# Patient Record
Sex: Female | Born: 1975
Health system: Southern US, Community
[De-identification: ages and names within clinical notes are randomized; demographics above are authoritative.]

## PROBLEM LIST (undated history)

## (undated) DIAGNOSIS — K219 Gastro-esophageal reflux disease without esophagitis: Secondary | ICD-10-CM

## (undated) DIAGNOSIS — I10 Essential (primary) hypertension: Secondary | ICD-10-CM

## (undated) HISTORY — PX: TUBAL LIGATION: SHX77

---

## 2017-03-09 ENCOUNTER — Encounter (HOSPITAL_BASED_OUTPATIENT_CLINIC_OR_DEPARTMENT_OTHER): Payer: Self-pay | Admitting: Emergency Medicine

## 2017-03-09 ENCOUNTER — Emergency Department (HOSPITAL_BASED_OUTPATIENT_CLINIC_OR_DEPARTMENT_OTHER)
Admission: EM | Admit: 2017-03-09 | Discharge: 2017-03-09 | Disposition: A | Discharge status: Home / Self Care | Payer: BLUE CROSS/BLUE SHIELD | Attending: Emergency Medicine | Admitting: Emergency Medicine

## 2017-03-09 DIAGNOSIS — M25512 Pain in left shoulder: Secondary | ICD-10-CM | POA: Diagnosis not present

## 2017-03-09 DIAGNOSIS — M791 Myalgia: Secondary | ICD-10-CM | POA: Insufficient documentation

## 2017-03-09 DIAGNOSIS — M7918 Myalgia, other site: Secondary | ICD-10-CM

## 2017-03-09 DIAGNOSIS — M545 Low back pain, unspecified: Secondary | ICD-10-CM

## 2017-03-09 LAB — URINALYSIS, MICROSCOPIC (REFLEX): RBC / HPF: NONE SEEN RBC/hpf (ref 0–5)

## 2017-03-09 LAB — URINALYSIS, ROUTINE W REFLEX MICROSCOPIC
Bilirubin Urine: NEGATIVE
Glucose, UA: NEGATIVE mg/dL
Ketones, ur: NEGATIVE mg/dL
LEUKOCYTES UA: NEGATIVE
NITRITE: NEGATIVE
Protein, ur: NEGATIVE mg/dL
SPECIFIC GRAVITY, URINE: 1.007 (ref 1.005–1.030)
pH: 6.5 (ref 5.0–8.0)

## 2017-03-09 LAB — PREGNANCY, URINE: PREG TEST UR: NEGATIVE

## 2017-03-09 MED ORDER — METHOCARBAMOL 500 MG PO TABS: 500.0000 mg | ORAL_TABLET | Freq: Two times a day (BID) | ORAL | 0 refills | Status: DC

## 2017-03-09 MED ORDER — METHOCARBAMOL 500 MG PO TABS
750.0000 mg | ORAL_TABLET | Freq: Once | ORAL | Status: AC
  Administered 2017-03-09: 750 mg via ORAL
  Filled 2017-03-09: qty 2

## 2017-03-09 NOTE — ED Provider Notes (Signed)
[facility]   CSN: [id number] Arrival date & time: 03/09/17  2026     History   Chief Complaint Chief Complaint  Patient presents with  . Back Pain    HPI Ashlee Berger is a 41 y.o. female.  HPI  41 y.o. female, presents to the Emergency Department today due to left shoulder pain as well as back pain x several days. Denies trauma or injury to the area. States pain worse with movement in lower back and TTP. This occurred x several days ago. Notes no urinary symptoms. No dysuria. No fevers. No abd pain. No N/V. No chills. Also notes left shoulder pain that started today. Worse with ROM. Pain with palpation on posterior aspect. No numbness/tingling. Rates pain 4/10. Intermittent. Took tylenol PTA. No CP/SOB. No other symptoms noted.    History reviewed. No pertinent past medical history.  There are no active problems to display for this patient.   History reviewed. No pertinent surgical history.  OB History    No data available       Home Medications    Prior to Admission medications   Not on File    Family History No family history on file.  Social History Social History  Substance Use Topics  . Smoking status: Not on file  . Smokeless tobacco: Not on file  . Alcohol use Not on file     Allergies   Patient has no known allergies.   Review of Systems Review of Systems ROS reviewed and all are negative for acute change except as noted in the HPI.  Physical Exam Updated Vital Signs BP (!) 141/101 (BP Location: Left Arm)   Pulse 100   Temp 98.3 F (36.8 C) (Oral)   Resp 18   Ht 5\' 2"  (1.575 m)   Wt 82.1 kg (181 lb)   SpO2 100%   BMI 33.11 kg/m   Physical Exam  Constitutional: She is oriented to person, place, and time. Vital signs are normal. She appears well-developed and well-nourished.  HENT:  Head: Normocephalic and atraumatic.  Right Ear: Hearing normal.  Left Ear: Hearing normal.  Eyes: Pupils are equal,  round, and reactive to light. Conjunctivae and EOM are normal.  Neck: Normal range of motion. Neck supple.  Cardiovascular: Normal rate, regular rhythm, normal heart sounds and intact distal pulses.   Pulmonary/Chest: Effort normal and breath sounds normal.  Abdominal: Soft. There is no tenderness.  Musculoskeletal: Normal range of motion.  Left Shoulder: Negative hawkins test, negative Neer's test, TTP over posterior shoulder. No pain with flexion/extension/abduction/adduction internal or external rotation. No obvious bony deformity.  Neurological: She is alert and oriented to person, place, and time.  Skin: Skin is warm and dry.  Psychiatric: She has a normal mood and affect. Her speech is normal and behavior is normal. Thought content normal.  Nursing note and vitals reviewed.    ED Treatments / Results  Labs (all labs ordered are listed, but only abnormal results are displayed) Labs Reviewed  URINALYSIS, ROUTINE W REFLEX MICROSCOPIC - Abnormal; Notable for the following:       Result Value   Hgb urine dipstick TRACE (*)    All other components within normal limits  URINALYSIS, MICROSCOPIC (REFLEX) - Abnormal; Notable for the following:    Bacteria, UA RARE (*)    Squamous Epithelial / LPF 0-5 (*)    All other components within normal limits  PREGNANCY, URINE    EKG  EKG Interpretation None  Radiology No results found.  Procedures Procedures (including critical care time)  Medications Ordered in ED Medications - No data to display   Initial Impression / Assessment and Plan / ED Course  I have reviewed the triage vital signs and the nursing notes.  Pertinent labs & imaging results that were available during my care of the patient were reviewed by me and considered in my medical decision making (see chart for details).  Final Clinical Impressions(s) / ED Diagnoses  {I have reviewed and evaluated the relevant laboratory values.   {I have reviewed the relevant  previous healthcare records.  {I obtained HPI from historian.   ED Course:  Assessment: Pt is a 41 y.o. female wpresents to the Emergency Department today due to left shoulder pain as well as back pain x several days. Denies trauma or injury to the area. States pain worse with movement in lower back and TTP. This occurred x several days ago. Notes no urinary symptoms. No dysuria. No fevers. No abd pain. No N/V. No chills. Also notes left shoulder pain that started today. Worse with ROM. Pain with palpation on posterior aspect. No numbness/tingling. Rates pain 4/10. Intermittent. Took tylenol PTA. No CP/SOB.. On exam, pt in NAD. Nontoxic/nonseptic appearing. VSS. Afebrile. Lungs CTA. Heart RRR. Abdomen nontender soft. Shoulder TPP posterior aspect. Likely rotator cuff injury. Low back pain unilateral on left aspect. No midline tenderness. Pain with palpation. Pain with ROM. Likely musculoskeletal as well. Labs unremarkable. Given analgesia in ED. Plan is to DC home with follow up to PCP. At time of discharge, Patient is in no acute distress. Vital Signs are stable. Patient is able to ambulate. Patient able to tolerate PO.    Disposition/Plan:  DC Home Additional Verbal discharge instructions given and discussed with patient.  Pt Instructed to f/u with PCP in the next week for evaluation and treatment of symptoms. Return precautions given Pt acknowledges and agrees with plan  Supervising Physician Doug SouJacubowitz, Sam, MD  Final diagnoses:  Acute left-sided low back pain without sciatica  Acute pain of left shoulder  Musculoskeletal pain    New Prescriptions New Prescriptions   No medications on file     Wilber BihariMohr, Farzad Tibbetts, PA-C 03/09/17 2221    Doug SouJacubowitz, Sam, MD 03/10/17 [phone]

## 2017-03-09 NOTE — Discharge Instructions (Signed)
Please read and follow all provided instructions.  Your diagnoses today include:  1. Acute left-sided low back pain without sciatica   2. Acute pain of left shoulder   3. Musculoskeletal pain     Tests performed today include: Vital signs. See below for your results today.   Medications prescribed:  Take as prescribed   Home care instructions:  Follow any educational materials contained in this packet.  Follow-up instructions: Please follow-up with your primary care provider for further evaluation of symptoms and treatment   Return instructions:  Please return to the Emergency Department if you do not get better, if you get worse, or new symptoms OR  - Fever (temperature greater than 101.38F)  - Bleeding that does not stop with holding pressure to the area    -Severe pain (please note that you may be more sore the day after your accident)  - Chest Pain  - Difficulty breathing  - Severe nausea or vomiting  - Inability to tolerate food and liquids  - Passing out  - Skin becoming red around your wounds  - Change in mental status (confusion or lethargy)  - New numbness or weakness    Please return if you have any other emergent concerns.  Additional Information:  Your vital signs today were: BP (!) 141/101 (BP Location: Left Arm)    Pulse 100    Temp 98.3 F (36.8 C) (Oral)    Resp 18    Ht 5\' 2"  (1.575 m)    Wt 82.1 kg (181 lb)    SpO2 100%    BMI 33.11 kg/m  If your blood pressure (BP) was elevated above 135/85 this visit, please have this repeated by your doctor within one month. ---------------

## 2017-03-09 NOTE — ED Notes (Signed)
PMS intact before and after. Pt tolerated well. All questions answered. 

## 2017-03-09 NOTE — ED Triage Notes (Signed)
PT presents with c/o left shoulder and back pain for a couple of days. Pt reports no injury

## 2017-03-09 NOTE — ED Notes (Signed)
Pt c/o lower back  and left arm and shoulder pain for 3-4 days now.  She also complaint of frontal headache.

## 2017-05-16 ENCOUNTER — Emergency Department (HOSPITAL_BASED_OUTPATIENT_CLINIC_OR_DEPARTMENT_OTHER): Payer: BLUE CROSS/BLUE SHIELD

## 2017-05-16 ENCOUNTER — Encounter (HOSPITAL_BASED_OUTPATIENT_CLINIC_OR_DEPARTMENT_OTHER): Payer: Self-pay | Admitting: *Deleted

## 2017-05-16 ENCOUNTER — Emergency Department (HOSPITAL_BASED_OUTPATIENT_CLINIC_OR_DEPARTMENT_OTHER)
Admission: EM | Admit: 2017-05-16 | Discharge: 2017-05-16 | Disposition: A | Discharge status: Home / Self Care | Payer: BLUE CROSS/BLUE SHIELD | Attending: Physician Assistant | Admitting: Physician Assistant

## 2017-05-16 DIAGNOSIS — Z79899 Other long term (current) drug therapy: Secondary | ICD-10-CM | POA: Insufficient documentation

## 2017-05-16 DIAGNOSIS — R0789 Other chest pain: Secondary | ICD-10-CM | POA: Diagnosis not present

## 2017-05-16 DIAGNOSIS — K449 Diaphragmatic hernia without obstruction or gangrene: Secondary | ICD-10-CM

## 2017-05-16 DIAGNOSIS — R079 Chest pain, unspecified: Secondary | ICD-10-CM | POA: Diagnosis not present

## 2017-05-16 DIAGNOSIS — I10 Essential (primary) hypertension: Secondary | ICD-10-CM | POA: Insufficient documentation

## 2017-05-16 DIAGNOSIS — R0602 Shortness of breath: Secondary | ICD-10-CM | POA: Diagnosis present

## 2017-05-16 HISTORY — DX: Essential (primary) hypertension: I10

## 2017-05-16 LAB — COMPREHENSIVE METABOLIC PANEL
ALK PHOS: 69 U/L (ref 38–126)
ALT: 12 U/L — ABNORMAL LOW (ref 14–54)
ANION GAP: 5 (ref 5–15)
AST: 12 U/L — ABNORMAL LOW (ref 15–41)
Albumin: 3.9 g/dL (ref 3.5–5.0)
BILIRUBIN TOTAL: 0.4 mg/dL (ref 0.3–1.2)
BUN: 19 mg/dL (ref 6–20)
CALCIUM: 8.6 mg/dL — AB (ref 8.9–10.3)
CO2: 27 mmol/L (ref 22–32)
Chloride: 105 mmol/L (ref 101–111)
Creatinine, Ser: 0.84 mg/dL (ref 0.44–1.00)
GFR calc non Af Amer: >60 mL/min (ref >60)
Glucose, Bld: 90 mg/dL (ref 65–99)
Potassium: 3.1 mmol/L — ABNORMAL LOW (ref 3.5–5.1)
SODIUM: 137 mmol/L (ref 135–145)
TOTAL PROTEIN: 6.7 g/dL (ref 6.5–8.1)

## 2017-05-16 LAB — D-DIMER, QUANTITATIVE: D-Dimer, Quant: 1 ug/mL-FEU — ABNORMAL HIGH (ref 0.00–0.50)

## 2017-05-16 LAB — CBC WITH DIFFERENTIAL/PLATELET
BASOS ABS: 0 10*3/uL (ref 0.0–0.1)
BASOS PCT: 0 %
Eosinophils Absolute: 0.2 10*3/uL (ref 0.0–0.7)
Eosinophils Relative: 2 %
HEMATOCRIT: 33 % — AB (ref 36.0–46.0)
HEMOGLOBIN: 11.1 g/dL — AB (ref 12.0–15.0)
Lymphocytes Relative: 41 %
Lymphs Abs: 3.9 10*3/uL (ref 0.7–4.0)
MCH: 31.1 pg (ref 26.0–34.0)
MCHC: 33.6 g/dL (ref 30.0–36.0)
MCV: 92.4 fL (ref 78.0–100.0)
Monocytes Absolute: 0.7 10*3/uL (ref 0.1–1.0)
Monocytes Relative: 8 %
NEUTROS ABS: 4.6 10*3/uL (ref 1.7–7.7)
NEUTROS PCT: 49 %
Platelets: 346 10*3/uL (ref 150–400)
RBC: 3.57 MIL/uL — ABNORMAL LOW (ref 3.87–5.11)
RDW: 12.4 % (ref 11.5–15.5)
WBC: 9.4 10*3/uL (ref 4.0–10.5)

## 2017-05-16 LAB — TROPONIN I

## 2017-05-16 LAB — PREGNANCY, URINE: PREG TEST UR: NEGATIVE

## 2017-05-16 MED ORDER — IOPAMIDOL (ISOVUE-370) INJECTION 76%
100.0000 mL | Freq: Once | INTRAVENOUS | Status: AC | PRN
  Administered 2017-05-16: 100 mL via INTRAVENOUS

## 2017-05-16 NOTE — ED Notes (Signed)
Pt returned from CT °

## 2017-05-16 NOTE — ED Notes (Signed)
Pt attempting to urinate at this time

## 2017-05-16 NOTE — ED Notes (Signed)
ED Provider at bedside. 

## 2017-05-16 NOTE — Discharge Instructions (Signed)
There are several good things about your workup. You did not have a pulmonary embolism. Your troponin (which shows stress on her heart) is negative. We did see an hernia on your CT of her chest. Sometimes when these are very symptomatic surgeons will repair them. Please follow-up with the surgeon as needed.

## 2017-05-16 NOTE — ED Notes (Signed)
Pt to XR at this time

## 2017-05-16 NOTE — ED Provider Notes (Signed)
[facility]   CSN: [id number] Arrival date & time: 05/16/17  1408     History   Chief Complaint Chief Complaint  Patient presents with  . Shortness of Breath  . Back Pain    HPI Ashlee Berger is a 41 y.o. female.  HPI   41 year old healthy-appearing female presenting with chest pain. She reports is worse with taking deep breaths. Patient initially statedit had going on a week. But when explored further patient had stress test done last month as a result the same type of chest pain. Patient is tearful during interview. When asked if she is having increased stress at home she stated no. She reports she was being seen by urgent care 2. She d"doesn't know the results of any my blood tests or x-rays". We have no access to those here.  She is on low-dose oral contraceptives.  Past Medical History:  Diagnosis Date  . Hypertension     There are no active problems to display for this patient.   History reviewed. No pertinent surgical history.  OB History    No data available       Home Medications    Prior to Admission medications   Medication Sig Start Date End Date Taking? Authorizing Provider  LISINOPRIL PO Take by mouth.   Yes [provider]  methocarbamol (ROBAXIN) 500 MG tablet Take 1 tablet (500 mg total) by mouth 2 (two) times daily. 03/09/17   Audry Pili, PA-C    Family History No family history on file.  Social History Social History  Substance Use Topics  . Smoking status: Never Smoker  . Smokeless tobacco: Never Used  . Alcohol use No     Allergies   Patient has no known allergies.   Review of Systems Review of Systems  Constitutional: Negative for activity change.  Respiratory: Negative for shortness of breath.   Cardiovascular: Positive for chest pain.  Gastrointestinal: Negative for abdominal pain.     Physical Exam Updated Vital Signs BP 120/74 (BP Location: Left Arm)   Pulse 76   Temp 97.7  F (36.5 C) (Oral)   Resp 16   Ht  (1.575 m)   Wt 80.7 kg (178 lb)   SpO2 100%   BMI 32.56 kg/m   Physical Exam  Constitutional: She is oriented to person, place, and time. She appears well-developed and well-nourished.  HENT:  Head: Normocephalic and atraumatic.  Eyes: Right eye exhibits no discharge.  Cardiovascular: Normal rate and regular rhythm.   No murmur heard. Pulmonary/Chest: Effort normal and breath sounds normal. No respiratory distress.  Abdominal: She exhibits no distension. There is no tenderness.  Neurological: She is oriented to person, place, and time.  Skin: Skin is warm and dry. She is not diaphoretic.  Psychiatric: She has a normal mood and affect.  Nursing note and vitals reviewed.    ED Treatments / Results  Labs (all labs ordered are listed, but only abnormal results are displayed) Labs Reviewed  COMPREHENSIVE METABOLIC PANEL - Abnormal; Notable for the following:       Result Value   Potassium 3.1 (*)    Calcium 8.6 (*)    AST 12 (*)    ALT 12 (*)    All other components within normal limits  CBC WITH DIFFERENTIAL/PLATELET - Abnormal; Notable for the following:    RBC 3.57 (*)    Hemoglobin 11.1 (*)    HCT 33.0 (*)    All other components within  normal limits  D-DIMER, QUANTITATIVE (NOT AT [facility]) - Abnormal; Notable for the following:    D-Dimer, Quant 1.00 (*)    All other components within normal limits  TROPONIN I  PREGNANCY, URINE    EKG  EKG Interpretation None       Radiology Dg Chest 2 View  Result Date: 05/16/2017 CLINICAL DATA:  LEFT chest pain for 1 month. EXAM: CHEST  2 VIEW COMPARISON:  None. FINDINGS: The heart size and mediastinal contours are within normal limits. Both lungs are clear. The visualized skeletal structures are unremarkable. IMPRESSION: No active cardiopulmonary disease. Electronically Signed   By: Elsie Stain M.D.   On: 05/16/2017 17:05   Ct Angio Chest Pe W And/or Wo Contrast  Result Date:  05/16/2017 CLINICAL DATA:  Cough and congestion, chest pain and shortness of breath for 1 month. EXAM: CT ANGIOGRAPHY CHEST WITH CONTRAST TECHNIQUE: Multidetector CT imaging of the chest was performed using the standard protocol during bolus administration of intravenous contrast. Multiplanar CT image reconstructions and MIPs were obtained to evaluate the vascular anatomy. CONTRAST:  Isovue 370, 100 mL. COMPARISON:  Chest radiograph earlier today. FINDINGS: Cardiovascular: Satisfactory opacification of the pulmonary arteries to the segmental level. No evidence of pulmonary embolism. Normal heart size. No pericardial effusion. Mediastinum/Nodes: No enlarged mediastinal, hilar, or axillary lymph nodes. Thyroid gland, and trachea demonstrate no significant findings. There is a moderate-sized retrocardiac hiatal hernia containing some layering fluid and air. Lungs/Pleura: Lungs are clear. No pleural effusion or pneumothorax. Upper Abdomen: No acute abnormality. Musculoskeletal: No chest wall abnormality. No acute or significant osseous findings. Minor thoracic disc disease. Review of the MIP images confirms the above findings. IMPRESSION: No evidence for pulmonary emboli. No infiltrate, edema, or effusion. Moderate size retrocardiac hiatal hernia, significance insert. Electronically Signed   By: Elsie Stain M.D.   On: 05/16/2017 18:35    Procedures Procedures (including critical care time)  Medications Ordered in ED Medications  iopamidol (ISOVUE-370) 76 % injection 100 mL (100 mLs Intravenous Contrast Given 05/16/17 1814)     Initial Impression / Assessment and Plan / ED Course  I have reviewed the triage vital signs and the nursing notes.  Pertinent labs & imaging results that were available during my care of the patient were reviewed by me and considered in my medical decision making (see chart for details).      41 year old healthy-appearing female presenting with chest pain. She reports is  worse with taking deep breaths. Patient initially statedit had going on a week. But when explored further patient had stress test done last month as a result the same type of chest pain. Patient is tearful during interview. When asked if she is having increased stress at home she stated no. She reports she was being seen by urgent care 2. She "doesn't know the results of any my blood tests or x-rays". We have no access to those here.  She is on low-dose oral contraceptives.  5:01 PM  Sounds the patient has already had quite a workup given that she's had a stress test at age of 56. However we will do d-dimer, chest x-ray today.  D-dimer positive CT negative for PE.  Patient's CT shows hiatal hernia. This could be cause of her symptoms. We discussed with her that sometimes if  very symptomatic, surgeons will intervene. We'll have her follow up with surgery as an outpatient.  Final Clinical Impressions(s) / ED Diagnoses   Final diagnoses:  Other chest pain  Hiatal hernia  New Prescriptions Discharge Medication List as of 05/16/2017  6:56 PM       Abelino Derrick, MD 05/16/17 2317

## 2017-05-16 NOTE — ED Triage Notes (Addendum)
Cough and congestion. She was been seen at [facility] x 2 and had negative CXR, Ddimer and given Mobic and Vicodin for pain. Not getting better. No distress at triage.

## 2017-05-20 ENCOUNTER — Emergency Department (HOSPITAL_BASED_OUTPATIENT_CLINIC_OR_DEPARTMENT_OTHER)
Admission: EM | Admit: 2017-05-20 | Discharge: 2017-05-20 | Disposition: A | Discharge status: Home / Self Care | Payer: BLUE CROSS/BLUE SHIELD | Attending: Emergency Medicine | Admitting: Emergency Medicine

## 2017-05-20 ENCOUNTER — Encounter (HOSPITAL_BASED_OUTPATIENT_CLINIC_OR_DEPARTMENT_OTHER): Payer: Self-pay | Admitting: *Deleted

## 2017-05-20 DIAGNOSIS — R1084 Generalized abdominal pain: Secondary | ICD-10-CM | POA: Diagnosis not present

## 2017-05-20 DIAGNOSIS — R05 Cough: Secondary | ICD-10-CM | POA: Insufficient documentation

## 2017-05-20 DIAGNOSIS — I10 Essential (primary) hypertension: Secondary | ICD-10-CM | POA: Insufficient documentation

## 2017-05-20 DIAGNOSIS — Z79899 Other long term (current) drug therapy: Secondary | ICD-10-CM | POA: Insufficient documentation

## 2017-05-20 DIAGNOSIS — R109 Unspecified abdominal pain: Secondary | ICD-10-CM | POA: Diagnosis present

## 2017-05-20 LAB — URINALYSIS, ROUTINE W REFLEX MICROSCOPIC
Bilirubin Urine: NEGATIVE
Glucose, UA: NEGATIVE mg/dL
Ketones, ur: NEGATIVE mg/dL
LEUKOCYTES UA: NEGATIVE
NITRITE: NEGATIVE
PROTEIN: NEGATIVE mg/dL
Specific Gravity, Urine: 1.025 (ref 1.005–1.030)
pH: 6 (ref 5.0–8.0)

## 2017-05-20 LAB — COMPREHENSIVE METABOLIC PANEL
ALT: 12 U/L — AB (ref 14–54)
AST: 14 U/L — ABNORMAL LOW (ref 15–41)
Albumin: 3.7 g/dL (ref 3.5–5.0)
Alkaline Phosphatase: 59 U/L (ref 38–126)
Anion gap: 7 (ref 5–15)
BUN: 15 mg/dL (ref 6–20)
CHLORIDE: 106 mmol/L (ref 101–111)
CO2: 24 mmol/L (ref 22–32)
CREATININE: 0.78 mg/dL (ref 0.44–1.00)
Calcium: 8.7 mg/dL — ABNORMAL LOW (ref 8.9–10.3)
GFR calc non Af Amer: >60 mL/min (ref >60)
Glucose, Bld: 83 mg/dL (ref 65–99)
Potassium: 3.4 mmol/L — ABNORMAL LOW (ref 3.5–5.1)
SODIUM: 137 mmol/L (ref 135–145)
Total Bilirubin: 0.4 mg/dL (ref 0.3–1.2)
Total Protein: 6.5 g/dL (ref 6.5–8.1)

## 2017-05-20 LAB — URINALYSIS, MICROSCOPIC (REFLEX)

## 2017-05-20 LAB — CBC
HEMATOCRIT: 32.4 % — AB (ref 36.0–46.0)
HEMOGLOBIN: 10.8 g/dL — AB (ref 12.0–15.0)
MCH: 30.8 pg (ref 26.0–34.0)
MCHC: 33.3 g/dL (ref 30.0–36.0)
MCV: 92.3 fL (ref 78.0–100.0)
Platelets: 309 10*3/uL (ref 150–400)
RBC: 3.51 MIL/uL — AB (ref 3.87–5.11)
RDW: 12.7 % (ref 11.5–15.5)
WBC: 7.8 10*3/uL (ref 4.0–10.5)

## 2017-05-20 LAB — LIPASE, BLOOD: Lipase: 35 U/L (ref 11–51)

## 2017-05-20 LAB — PREGNANCY, URINE: Preg Test, Ur: NEGATIVE

## 2017-05-20 NOTE — ED Provider Notes (Signed)
[facility]   CSN: [id number] Arrival date & time: 05/20/17  1409     History   Chief Complaint Chief Complaint  Patient presents with  . Abdominal Pain    HPI Ashlee Berger is a 41 y.o. female.  41yo F w/ PMH including HTN who p/w abdominal pain. Yesterday she began having generalized, intermittent moderate abdominal pain that occasionally radiates to both sides of her back. Pain is worse at work, where she stands all day and does a lot lifting. Pain seems to be better with rest, no association with eating. She's had nausea, no vomiting, fevers, diarrhea, or bloody stools. No dysuria, vaginal bleeding or discharge. She occasionally has blood in urine which is being followed by a nephrologist. She has had mild cough recently. No heavy NSAID or alcohol use.    The history is provided by the patient.  Abdominal Pain      Past Medical History:  Diagnosis Date  . Hypertension     There are no active problems to display for this patient.   History reviewed. No pertinent surgical history.  OB History    No data available       Home Medications    Prior to Admission medications   Medication Sig Start Date End Date Taking? Authorizing Provider  LISINOPRIL PO Take by mouth.    [provider]  methocarbamol (ROBAXIN) 500 MG tablet Take 1 tablet (500 mg total) by mouth 2 (two) times daily. 03/09/17   Audry Pili, PA-C    Family History No family history on file.  Social History Social History  Substance Use Topics  . Smoking status: Never Smoker  . Smokeless tobacco: Never Used  . Alcohol use No     Allergies   Patient has no known allergies.   Review of Systems Review of Systems  Gastrointestinal: Positive for abdominal pain.  All other systems reviewed and are negative except that which was mentioned in HPI    Physical Exam Updated Vital Signs BP (!) 132/97 (BP Location: Left Arm)   Pulse 78   Temp 98.4 F  (36.9 C) (Oral)   Resp 16   Ht  (1.575 m)   Wt 80.7 kg (178 lb)   LMP  (LMP Unknown)   SpO2 100%   BMI 32.56 kg/m   Physical Exam  Constitutional: She is oriented to person, place, and time. She appears well-developed and well-nourished. No distress.  comfortable  HENT:  Head: Normocephalic and atraumatic.  Moist mucous membranes  Eyes: Pupils are equal, round, and reactive to light. Conjunctivae are normal.  Neck: Neck supple.  Cardiovascular: Normal rate, regular rhythm and normal heart sounds.   No murmur heard. Pulmonary/Chest: Effort normal and breath sounds normal.  Abdominal: Soft. Bowel sounds are normal. She exhibits no distension. There is no tenderness.  Musculoskeletal: She exhibits no edema.  Neurological: She is alert and oriented to person, place, and time.  Fluent speech  Skin: Skin is warm and dry.  Psychiatric: She has a normal mood and affect. Judgment normal.  Calm and cooperative  Nursing note and vitals reviewed.    ED Treatments / Results  Labs (all labs ordered are listed, but only abnormal results are displayed) Labs Reviewed  URINALYSIS, ROUTINE W REFLEX MICROSCOPIC - Abnormal; Notable for the following:       Result Value   Hgb urine dipstick SMALL (*)    All other components within normal limits  COMPREHENSIVE METABOLIC PANEL -  Abnormal; Notable for the following:    Potassium 3.4 (*)    Calcium 8.7 (*)    AST 14 (*)    ALT 12 (*)    All other components within normal limits  CBC - Abnormal; Notable for the following:    RBC 3.51 (*)    Hemoglobin 10.8 (*)    HCT 32.4 (*)    All other components within normal limits  URINALYSIS, MICROSCOPIC (REFLEX) - Abnormal; Notable for the following:    Bacteria, UA FEW (*)    Squamous Epithelial / LPF 0-5 (*)    All other components within normal limits  PREGNANCY, URINE  LIPASE, BLOOD    EKG  EKG Interpretation None       Radiology No results found.  Procedures Procedures  (including critical care time)  Medications Ordered in ED Medications - No data to display   Initial Impression / Assessment and Plan / ED Course  I have reviewed the triage vital signs and the nursing notes.  Pertinent labs  that were available during my care of the patient were reviewed by me and considered in my medical decision making (see chart for details).     PT w/ intermittent abdominal pain since yesterday, no other associated sx. Well appearing with normal VS on exam. No focal abd tenderness. Labs unremarkable with normal WBC count, LFTS, lipase, and creatinine. UA negative. Mild anemia. PT remains comfortable and pain free on re-eval. Given no pain or tenderness, I do not feel she needs imaging currently. Reviewed return precautions.  Final Clinical Impressions(s) / ED Diagnoses   Final diagnoses:  Generalized abdominal pain    New Prescriptions Discharge Medication List as of 05/20/2017  5:18 PM       Kynnadi Dicenso, Ambrose Finland, MD 05/21/17 1201

## 2017-05-20 NOTE — ED Notes (Signed)
ED Provider at bedside. 

## 2017-05-20 NOTE — ED Triage Notes (Signed)
She was seen 4 days ago and diagnosed with a abdominal hernia. Back today with pain. She has not seen the surgeon yet.

## 2017-05-29 ENCOUNTER — Other Ambulatory Visit: Payer: Self-pay | Admitting: General Surgery

## 2017-05-29 DIAGNOSIS — K8020 Calculus of gallbladder without cholecystitis without obstruction: Secondary | ICD-10-CM

## 2017-06-03 ENCOUNTER — Ambulatory Visit
Admission: RE | Admit: 2017-06-03 | Discharge: 2017-06-03 | Disposition: A | Discharge status: Home / Self Care | Payer: BLUE CROSS/BLUE SHIELD | Source: Ambulatory Visit | Attending: General Surgery | Admitting: General Surgery

## 2017-06-03 DIAGNOSIS — K8020 Calculus of gallbladder without cholecystitis without obstruction: Secondary | ICD-10-CM

## 2017-06-05 ENCOUNTER — Other Ambulatory Visit (HOSPITAL_COMMUNITY): Payer: Self-pay | Admitting: General Surgery

## 2017-06-05 DIAGNOSIS — K449 Diaphragmatic hernia without obstruction or gangrene: Secondary | ICD-10-CM

## 2017-06-24 ENCOUNTER — Ambulatory Visit (HOSPITAL_COMMUNITY): Admission: RE | Admit: 2017-06-24 | Payer: BLUE CROSS/BLUE SHIELD | Source: Ambulatory Visit

## 2017-07-12 ENCOUNTER — Emergency Department (HOSPITAL_BASED_OUTPATIENT_CLINIC_OR_DEPARTMENT_OTHER): Payer: BLUE CROSS/BLUE SHIELD

## 2017-07-12 ENCOUNTER — Other Ambulatory Visit: Payer: Self-pay

## 2017-07-12 ENCOUNTER — Emergency Department (HOSPITAL_BASED_OUTPATIENT_CLINIC_OR_DEPARTMENT_OTHER)
Admission: EM | Admit: 2017-07-12 | Discharge: 2017-07-12 | Disposition: A | Discharge status: Home / Self Care | Payer: BLUE CROSS/BLUE SHIELD | Attending: Emergency Medicine | Admitting: Emergency Medicine

## 2017-07-12 ENCOUNTER — Encounter (HOSPITAL_BASED_OUTPATIENT_CLINIC_OR_DEPARTMENT_OTHER): Payer: Self-pay | Admitting: *Deleted

## 2017-07-12 DIAGNOSIS — Z79899 Other long term (current) drug therapy: Secondary | ICD-10-CM | POA: Insufficient documentation

## 2017-07-12 DIAGNOSIS — I10 Essential (primary) hypertension: Secondary | ICD-10-CM | POA: Insufficient documentation

## 2017-07-12 DIAGNOSIS — M79605 Pain in left leg: Secondary | ICD-10-CM | POA: Diagnosis not present

## 2017-07-12 DIAGNOSIS — R0789 Other chest pain: Secondary | ICD-10-CM | POA: Diagnosis not present

## 2017-07-12 DIAGNOSIS — R079 Chest pain, unspecified: Secondary | ICD-10-CM | POA: Diagnosis present

## 2017-07-12 LAB — BASIC METABOLIC PANEL
ANION GAP: 7 (ref 5–15)
BUN: 16 mg/dL (ref 6–20)
CALCIUM: 8.9 mg/dL (ref 8.9–10.3)
CO2: 22 mmol/L (ref 22–32)
CREATININE: 0.89 mg/dL (ref 0.44–1.00)
Chloride: 104 mmol/L (ref 101–111)
GLUCOSE: 86 mg/dL (ref 65–99)
Potassium: 3.4 mmol/L — ABNORMAL LOW (ref 3.5–5.1)
Sodium: 133 mmol/L — ABNORMAL LOW (ref 135–145)

## 2017-07-12 LAB — CBC WITH DIFFERENTIAL/PLATELET
BASOS ABS: 0 10*3/uL (ref 0.0–0.1)
BASOS PCT: 1 %
EOS PCT: 3 %
Eosinophils Absolute: 0.2 10*3/uL (ref 0.0–0.7)
HEMATOCRIT: 32.9 % — AB (ref 36.0–46.0)
Hemoglobin: 11.1 g/dL — ABNORMAL LOW (ref 12.0–15.0)
Lymphocytes Relative: 39 %
Lymphs Abs: 2.4 10*3/uL (ref 0.7–4.0)
MCH: 30.7 pg (ref 26.0–34.0)
MCHC: 33.7 g/dL (ref 30.0–36.0)
MCV: 91.1 fL (ref 78.0–100.0)
MONO ABS: 0.6 10*3/uL (ref 0.1–1.0)
MONOS PCT: 10 %
NEUTROS ABS: 2.8 10*3/uL (ref 1.7–7.7)
Neutrophils Relative %: 47 %
PLATELETS: 339 10*3/uL (ref 150–400)
RBC: 3.61 MIL/uL — ABNORMAL LOW (ref 3.87–5.11)
RDW: 12.3 % (ref 11.5–15.5)
WBC: 6 10*3/uL (ref 4.0–10.5)

## 2017-07-12 LAB — PREGNANCY, URINE: Preg Test, Ur: NEGATIVE

## 2017-07-12 LAB — D-DIMER, QUANTITATIVE: D-Dimer, Quant: 0.27 ug/mL-FEU (ref 0.00–0.50)

## 2017-07-12 LAB — TROPONIN I

## 2017-07-12 NOTE — ED Triage Notes (Signed)
Sharp pain in the center of her chest x 2 weeks.

## 2017-07-12 NOTE — ED Provider Notes (Signed)
[facility] EMERGENCY DEPARTMENT Provider Note   CSN: [id number] Arrival date & time: 07/12/17  1408     History   Chief Complaint Chief Complaint  Patient presents with  . Chest Pain    HPI Ashlee Berger is a 41 y.o. female.  The history is provided by the patient.  Chest Pain   This is a new problem. The current episode started more than 1 week ago. The problem occurs daily. The problem has not changed since onset.The pain is associated with movement and breathing. The pain is present in the substernal region. The pain is moderate. The quality of the pain is described as sharp. The pain does not radiate. Duration of episode(s) is 2 weeks. The symptoms are aggravated by certain positions and deep breathing. Associated symptoms include leg pain. Pertinent negatives include no abdominal pain, no back pain, no cough, no diaphoresis, no fever, no lower extremity edema, no nausea, no near-syncope, no shortness of breath, no sputum production, no syncope, no vomiting and no weakness. Treatments tried: tylenol. The treatment provided no relief. There are no known risk factors.   41 year old female who presents with chest pain.  Endorses intermittent anterior chest pain for 2 weeks, worse with deep inspiration and movement.  Has not had similar symptoms in the past.  Denies any heavy lifting, exertional activity or trauma.  Denies associated shortness of breath, syncope or near syncope, leg swelling, nausea or vomiting, or diaphoresis.  Has noted some mild left calf tenderness to palpation but no swelling.  Take Depo-Provera birth control but no estrogen replacement therapy.  No recent immobilization.  No personal or family history of PE/DVT or early family history of cardiac disease.   Past Medical History:  Diagnosis Date  . Hypertension     There are no active problems to display for this patient.   History reviewed. No pertinent surgical history.  OB History    No data  available       Home Medications    Prior to Admission medications   Medication Sig Start Date End Date Taking? Authorizing Provider  LISINOPRIL PO Take by mouth.    [provider]  methocarbamol (ROBAXIN) 500 MG tablet Take 1 tablet (500 mg total) by mouth 2 (two) times daily. 03/09/17   Audry PiliMohr, Tyler, PA-C    Family History No family history on file.  Social History Social History   Tobacco Use  . Smoking status: Never Smoker  . Smokeless tobacco: Never Used  Substance Use Topics  . Alcohol use: No  . Drug use: No     Allergies   Patient has no known allergies.   Review of Systems Review of Systems  Constitutional: Negative for diaphoresis and fever.  Respiratory: Negative for cough, sputum production and shortness of breath.   Cardiovascular: Positive for chest pain. Negative for syncope and near-syncope.  Gastrointestinal: Negative for abdominal pain, nausea and vomiting.  Musculoskeletal: Negative for back pain.  Neurological: Negative for weakness.  All other systems reviewed and are negative.    Physical Exam Updated Vital Signs BP 119/87 (BP Location: Right Arm)   Pulse (!) 101   Temp 98.2 F (36.8 C) (Oral)   Resp 18   Ht 5\' 2"  (1.575 m)   Wt 81.2 kg (179 lb)   SpO2 100%   BMI 32.74 kg/m   Physical Exam Physical Exam  Nursing note and vitals reviewed. Constitutional: Well developed, well nourished, non-toxic, and in no acute distress Head:  Normocephalic and atraumatic.  Mouth/Throat: Oropharynx is clear and moist.  Neck: Normal range of motion. Neck supple.  Cardiovascular: Normal rate and regular rhythm.   Pulmonary/Chest: Effort normal and breath sounds normal. Tenderness to palpation over sternum Abdominal: Soft. There is no tenderness. There is no rebound and no guarding.  Musculoskeletal: Normal range of motion. No leg swelling. Left calf tenderness to palpation. Neurological: Alert, no facial droop, fluent speech, moves all  extremities symmetrically Skin: Skin is warm and dry.  Psychiatric: Cooperative   ED Treatments / Results  Labs (all labs ordered are listed, but only abnormal results are displayed) Labs Reviewed  CBC WITH DIFFERENTIAL/PLATELET - Abnormal; Notable for the following components:      Result Value   RBC 3.61 (*)    Hemoglobin 11.1 (*)    HCT 32.9 (*)    All other components within normal limits  BASIC METABOLIC PANEL - Abnormal; Notable for the following components:   Sodium 133 (*)    Potassium 3.4 (*)    All other components within normal limits  PREGNANCY, URINE  TROPONIN I  D-DIMER, QUANTITATIVE (NOT AT [facility])    EKG  EKG Interpretation  Date/Time:  Friday July 12 2017 14:15:25 EST Ventricular Rate:  101 PR Interval:  122 QRS Duration: 82 QT Interval:  324 QTC Calculation: 420 R Axis:   39 Text Interpretation:  Sinus tachycardia Otherwise normal ECG increased rate, otherwise no acute changes  Confirmed by Crista Curb [phone]) on 07/12/2017 3:08:40 PM       Radiology Dg Chest 2 View  Result Date: 07/12/2017 CLINICAL DATA:  Chest pain. EXAM: CHEST  2 VIEW COMPARISON:  Radiographs of May 16, 2017. FINDINGS: The heart size and mediastinal contours are within normal limits. Both lungs are clear. No pneumothorax or pleural effusion is noted. The visualized skeletal structures are unremarkable. IMPRESSION: No active cardiopulmonary disease. Electronically Signed   By: Lupita Raider, M.D.   On: 07/12/2017 15:54    Procedures Procedures (including critical care time)  Medications Ordered in ED Medications - No data to display   Initial Impression / Assessment and Plan / ED Course  I have reviewed the triage vital signs and the nursing notes.  Pertinent labs & imaging results that were available during my care of the patient were reviewed by me and considered in my medical decision making (see chart for details).      Suspect musculoskeletal pain. Reproducible  with palpation.  However given concurrent left calf tenderness with initially mildly tachycardic heart rate, did consider PE given pleuritic nature of pain. Ddimer is negative, and she is felt to be ruled out.  Pain seems very atypical for ACS.  Also low suspicion for dissection or other serious cardiopulmonary/intrathoracic etiologies.  Her EKG is nonischemic.  Troponin is normal.  Given ongoing symptoms for 2 weeks I do not think she would benefit from serial cardiac enzymes, and seems atypical for ACS.  Chest x-ray visualized and shows no acute cardiopulmonary processes.  At this time I feel that we have ruled out serious etiologies of her symptoms.  Clinically her pain seems more musculoskeletal in nature, and I reviewed supportive care management for home. Strict return and follow-up instructions reviewed. She expressed understanding of all discharge instructions and felt comfortable with the plan of care.   Final Clinical Impressions(s) / ED Diagnoses   Final diagnoses:  Atypical chest pain  Chest wall pain    ED Discharge Orders    None  Lavera GuiseLiu, Ismaeel Arvelo Duo, MD 07/12/17 [phone]

## 2017-07-12 NOTE — Discharge Instructions (Signed)
Your pain is likely muscle pain. Continue tylenol and heat packs. Follow-up with your PCP for ongoing pain management.  Your work-up today is reassuring. Please return without fail for worsening symptoms, including difficulty breathing, passing out, escalating pain or any other symptoms concerning to you.

## 2017-08-26 ENCOUNTER — Other Ambulatory Visit: Payer: Self-pay

## 2017-08-26 ENCOUNTER — Emergency Department (HOSPITAL_BASED_OUTPATIENT_CLINIC_OR_DEPARTMENT_OTHER)
Admission: EM | Admit: 2017-08-26 | Discharge: 2017-08-26 | Disposition: A | Discharge status: Home / Self Care | Payer: BLUE CROSS/BLUE SHIELD | Attending: Emergency Medicine | Admitting: Emergency Medicine

## 2017-08-26 ENCOUNTER — Emergency Department (HOSPITAL_BASED_OUTPATIENT_CLINIC_OR_DEPARTMENT_OTHER): Payer: BLUE CROSS/BLUE SHIELD

## 2017-08-26 ENCOUNTER — Encounter (HOSPITAL_BASED_OUTPATIENT_CLINIC_OR_DEPARTMENT_OTHER): Payer: Self-pay | Admitting: *Deleted

## 2017-08-26 DIAGNOSIS — M62830 Muscle spasm of back: Secondary | ICD-10-CM | POA: Insufficient documentation

## 2017-08-26 DIAGNOSIS — R110 Nausea: Secondary | ICD-10-CM | POA: Diagnosis not present

## 2017-08-26 DIAGNOSIS — Z79899 Other long term (current) drug therapy: Secondary | ICD-10-CM | POA: Insufficient documentation

## 2017-08-26 DIAGNOSIS — R079 Chest pain, unspecified: Secondary | ICD-10-CM

## 2017-08-26 DIAGNOSIS — R0789 Other chest pain: Secondary | ICD-10-CM | POA: Insufficient documentation

## 2017-08-26 DIAGNOSIS — M546 Pain in thoracic spine: Secondary | ICD-10-CM | POA: Diagnosis not present

## 2017-08-26 DIAGNOSIS — R0602 Shortness of breath: Secondary | ICD-10-CM | POA: Insufficient documentation

## 2017-08-26 HISTORY — DX: Gastro-esophageal reflux disease without esophagitis: K21.9

## 2017-08-26 MED ORDER — CYCLOBENZAPRINE HCL 10 MG PO TABS: 5.0000 mg | ORAL_TABLET | Freq: Every day | ORAL | 0 refills | Status: AC

## 2017-08-26 MED ORDER — ACETAMINOPHEN 500 MG PO TABS: 1000.0000 mg | ORAL_TABLET | Freq: Three times a day (TID) | ORAL | 0 refills | Status: AC

## 2017-08-26 MED FILL — CYCLOBENZAPRINE HCL 10 MG T: 10 | 10 days supply | Qty: 10 | Fill #0

## 2017-08-26 NOTE — ED Provider Notes (Signed)
[facility] EMERGENCY DEPARTMENT Provider Note  CSN: [id number] Arrival date & time: 08/26/17 1408  Chief Complaint(s) Chest Pain  HPI Ashlee Berger is a 42 y.o. female   The history is provided by the patient.  Chest Pain   This is a recurrent problem. Episode onset: 4-5 months. Episode frequency: intermittent. Progression since onset: fluctuating. The pain is associated with breathing, movement and lifting. Pain location: sternal and lateral chest, bilaterally. Pain severity now: mild - moderate. The quality of the pain is described as sharp. Radiates to: around to the back. The symptoms are aggravated by certain positions and deep breathing (upper extremity movement). Associated symptoms include back pain, nausea and shortness of breath (mild). Pertinent negatives include no cough, no diaphoresis, no fever, no leg pain, no lower extremity edema, no malaise/fatigue and no vomiting. Treatments tried: OTC meds, heat and ice. The treatment provided no relief. Risk factors include obesity.  Her past medical history is significant for hypertension.  Pertinent negatives for past medical history include no CAD, no COPD, no CHF, no diabetes, no DVT, no hyperlipidemia, no MI, no PE and no strokes.  Her family medical history is significant for early MI.   Patient reports that she works at a [employer], [employer].  However states that she has not been doing so for several weeks due to this pain.  Past Medical History Past Medical History:  Diagnosis Date  . GERD (gastroesophageal reflux disease)    There are no active problems to display for this patient.  Home Medication(s) Prior to Admission medications   Medication Sig Start Date End Date Taking? Authorizing Provider  acetaminophen (TYLENOL) 500 MG tablet Take 2 tablets (1,000 mg total) by mouth every 8 (eight) hours for 5 days. Do not take more than 4000 mg of acetaminophen (Tylenol) in a 24-hour period. Please note  that other medicines that you may be prescribed may have Tylenol as well. 08/26/17 08/31/17  Nira Conn, MD  cyclobenzaprine (FLEXERIL) 10 MG tablet Take 0.5-1 tablets (5-10 mg total) by mouth at bedtime for 10 days. 08/26/17 09/05/17  Nira Conn, MD  LISINOPRIL PO Take by mouth.    [provider]  methocarbamol (ROBAXIN) 500 MG tablet Take 1 tablet (500 mg total) by mouth 2 (two) times daily. 03/09/17   Audry Pili, PA-C                                                                                                                                    Past Surgical History History reviewed. No pertinent surgical history. Family History No family history on file.  Social History Social History   Tobacco Use  . Smoking status: Never Smoker  . Smokeless tobacco: Never Used  Substance Use Topics  . Alcohol use: No  . Drug use: No   Allergies Patient has no known allergies.  Review of Systems Review of Systems  Constitutional:  Negative for diaphoresis, fever and malaise/fatigue.  Respiratory: Positive for shortness of breath (mild). Negative for cough.   Cardiovascular: Positive for chest pain.  Gastrointestinal: Positive for nausea. Negative for vomiting.  Musculoskeletal: Positive for back pain.    Physical Exam Vital Signs  I have reviewed the triage vital signs BP 125/88   Pulse 97   Temp 98.3 F (36.8 C) (Oral)   Resp 16   Ht 5\' 2"  (1.575 m)   Wt 80.3 kg (177 lb)   SpO2 99%   BMI 32.37 kg/m   Physical Exam  Constitutional: She is oriented to person, place, and time. She appears well-developed and well-nourished. No distress.  HENT:  Head: Normocephalic and atraumatic.  Nose: Nose normal.  Eyes: Conjunctivae and EOM are normal. Pupils are equal, round, and reactive to light. Right eye exhibits no discharge. Left eye exhibits no discharge. No scleral icterus.  Neck: Normal range of motion. Neck supple.  Cardiovascular: Normal rate and  regular rhythm. Exam reveals no gallop and no friction rub.  No murmur heard. Pulmonary/Chest: Effort normal and breath sounds normal. No stridor. No respiratory distress. She has no rales.     She exhibits tenderness.    Abdominal: Soft. She exhibits no distension. There is no tenderness.  Musculoskeletal: She exhibits no edema or tenderness.  Neurological: She is alert and oriented to person, place, and time.  Skin: Skin is warm and dry. No rash noted. She is not diaphoretic. No erythema.  Psychiatric: She has a normal mood and affect.  Vitals reviewed.   ED Results and Treatments Labs (all labs ordered are listed, but only abnormal results are displayed) Labs Reviewed - No data to display                                                                                                                       EKG  EKG Interpretation  Date/Time:  Monday August 26 2017 14:09:58 EST Ventricular Rate:  94 PR Interval:  116 QRS Duration: 118 QT Interval:  360 QTC Calculation: 450 R Axis:   57 Text Interpretation:  Normal sinus rhythm Non-specific intra-ventricular conduction delay Nonspecific ST abnormality Abnormal ECG No significant change since last tracing Confirmed by Drema Pryardama, Kearstin Learn [phone]([id number]) on 08/26/2017 4:07:19 PM      Radiology Dg Chest 2 View  Result Date: 08/26/2017 CLINICAL DATA:  Intermittent chest pain and shortness of breath for the past 6 months. Nonsmoker. EXAM: CHEST  2 VIEW COMPARISON:  Chest x-ray of July 12, 2017 FINDINGS: The lungs are adequately inflated and clear. The heart and pulmonary vascularity are normal. The mediastinum is normal in width. There is no pleural effusion. IMPRESSION: There is no active cardiopulmonary disease. Electronically Signed   By: David  SwazilandJordan M.D.   On: 08/26/2017 16:20   Pertinent labs & imaging results that were available during my care of the patient were reviewed by me and considered in my medical decision making (see chart  for details).  Medications Ordered  in ED Medications - No data to display                                                                                                                                  Procedures Procedures  (including critical care time)  Medical Decision Making / ED Course I have reviewed the nursing notes for this encounter and the patient's prior records (if available in EHR or on provided paperwork).    Presentation is most consistent with chest wall pain.  Highly inconsistent with ACS.  EKG without acute ischemic changes or evidence of pericarditis.  Do not feel that cardiac enzymes are necessary at this time.  Presentation is not classic for aortic dissection or esophageal perforation.  Low pretest probability for pulmonary embolism. Chest x-ray without evidence suggestive of pneumonia, pneumothorax, pneumomediastinum.  No abnormal contour of the mediastinum to suggest dissection. No evidence of acute injuries.   The patient appears reasonably screened and/or stabilized for discharge and I doubt any other medical condition or other [facility] requiring further screening, evaluation, or treatment in the ED at this time prior to discharge.  The patient is safe for discharge with strict return precautions.  Final Clinical Impression(s) / ED Diagnoses Final diagnoses:  Chest pain  Acute bilateral thoracic back pain  Muscle spasm of back   Disposition: Discharge  Condition: Good  I have discussed the results, Dx and Tx plan with the patient who expressed understanding and agree(s) with the plan. Discharge instructions discussed at great length. The patient was given strict return precautions who verbalized understanding of the instructions. No further questions at time of discharge.    ED Discharge Orders        Ordered    acetaminophen (TYLENOL) 500 MG tablet  Every 8 hours     08/26/17 1625    cyclobenzaprine (FLEXERIL) 10 MG tablet  Daily at bedtime     08/26/17  1627       Follow Up: Rehabilitation, [facility] &  Schedule an appointment as soon as possible for a visit  As needed      This chart was dictated using voice recognition software.  Despite best efforts to proofread,  errors can occur which can change the documentation meaning.   Nira Conn, MD 08/26/17 [phone]

## 2017-08-26 NOTE — Discharge Instructions (Signed)
You may use over-the-counter Acetaminophen (Tylenol), topical muscle creams such as SalonPas, Icy Hot, Bengay, etc. Please stretch, apply heat, and have massage therapy for additional assistance.  

## 2017-08-26 NOTE — ED Triage Notes (Signed)
Chest pain. States she feels pain in her back when she takes a breath.

## 2017-10-05 ENCOUNTER — Emergency Department (HOSPITAL_BASED_OUTPATIENT_CLINIC_OR_DEPARTMENT_OTHER): Payer: BLUE CROSS/BLUE SHIELD

## 2017-10-05 ENCOUNTER — Other Ambulatory Visit: Payer: Self-pay

## 2017-10-05 ENCOUNTER — Encounter (HOSPITAL_BASED_OUTPATIENT_CLINIC_OR_DEPARTMENT_OTHER): Payer: Self-pay | Admitting: Emergency Medicine

## 2017-10-05 ENCOUNTER — Emergency Department (HOSPITAL_BASED_OUTPATIENT_CLINIC_OR_DEPARTMENT_OTHER)
Admission: EM | Admit: 2017-10-05 | Discharge: 2017-10-06 | Disposition: A | Discharge status: Home / Self Care | Payer: BLUE CROSS/BLUE SHIELD | Attending: Emergency Medicine | Admitting: Emergency Medicine

## 2017-10-05 DIAGNOSIS — M79662 Pain in left lower leg: Secondary | ICD-10-CM | POA: Insufficient documentation

## 2017-10-05 DIAGNOSIS — Z79899 Other long term (current) drug therapy: Secondary | ICD-10-CM | POA: Insufficient documentation

## 2017-10-05 DIAGNOSIS — R079 Chest pain, unspecified: Secondary | ICD-10-CM

## 2017-10-05 DIAGNOSIS — R0602 Shortness of breath: Secondary | ICD-10-CM | POA: Insufficient documentation

## 2017-10-05 DIAGNOSIS — R072 Precordial pain: Secondary | ICD-10-CM | POA: Insufficient documentation

## 2017-10-05 DIAGNOSIS — R05 Cough: Secondary | ICD-10-CM | POA: Insufficient documentation

## 2017-10-05 LAB — CBC
HEMATOCRIT: 33.1 % — AB (ref 36.0–46.0)
Hemoglobin: 11.1 g/dL — ABNORMAL LOW (ref 12.0–15.0)
MCH: 30.9 pg (ref 26.0–34.0)
MCHC: 33.5 g/dL (ref 30.0–36.0)
MCV: 92.2 fL (ref 78.0–100.0)
PLATELETS: 324 10*3/uL (ref 150–400)
RBC: 3.59 MIL/uL — AB (ref 3.87–5.11)
RDW: 11.9 % (ref 11.5–15.5)
WBC: 6.5 10*3/uL (ref 4.0–10.5)

## 2017-10-05 LAB — BASIC METABOLIC PANEL
Anion gap: 9 (ref 5–15)
BUN: 16 mg/dL (ref 6–20)
CHLORIDE: 106 mmol/L (ref 101–111)
CO2: 23 mmol/L (ref 22–32)
Calcium: 9 mg/dL (ref 8.9–10.3)
Creatinine, Ser: 0.86 mg/dL (ref 0.44–1.00)
GFR calc Af Amer: >60 mL/min (ref >60)
GFR calc non Af Amer: >60 mL/min (ref >60)
Glucose, Bld: 86 mg/dL (ref 65–99)
POTASSIUM: 3.3 mmol/L — AB (ref 3.5–5.1)
SODIUM: 138 mmol/L (ref 135–145)

## 2017-10-05 LAB — PREGNANCY, URINE: Preg Test, Ur: NEGATIVE

## 2017-10-05 LAB — D-DIMER, QUANTITATIVE: D-Dimer, Quant: 0.37 ug/mL-FEU (ref 0.00–0.50)

## 2017-10-05 LAB — TROPONIN I: Troponin I: <0.03 ng/mL (ref <0.03)

## 2017-10-05 MED ORDER — KETOROLAC TROMETHAMINE 30 MG/ML IJ SOLN
15.0000 mg | Freq: Once | INTRAMUSCULAR | Status: AC
  Administered 2017-10-05: 15 mg via INTRAVENOUS
  Filled 2017-10-05: qty 1

## 2017-10-05 MED ORDER — GI COCKTAIL ~~LOC~~
30.0000 mL | Freq: Once | ORAL | Status: AC
  Administered 2017-10-05: 30 mL via ORAL
  Filled 2017-10-05: qty 30

## 2017-10-05 NOTE — ED Triage Notes (Addendum)
Pt reports bilateral upper chest pain that began 2 weeks ago and has been intermittent. She reports pain radiates to her back. Associated shortness of breath, cough, nasal drainage, and PND.

## 2017-10-05 NOTE — ED Notes (Signed)
Patient transported to Ultrasound 

## 2017-10-05 NOTE — ED Notes (Signed)
ED Provider at bedside. 

## 2017-10-06 MED ORDER — METHOCARBAMOL 500 MG PO TABS: 500.0000 mg | ORAL_TABLET | Freq: Two times a day (BID) | ORAL | 0 refills | Status: AC

## 2017-10-06 MED ORDER — OMEPRAZOLE 20 MG PO CPDR: 20.0000 mg | DELAYED_RELEASE_CAPSULE | Freq: Every day | ORAL | 0 refills | Status: AC

## 2017-10-06 NOTE — ED Notes (Signed)
Pt given d/c instructions as per chart. Rx x 2 with precautions. Verbalizes understanding. No questions. 

## 2017-10-06 NOTE — Discharge Instructions (Signed)
Your workup has been very reassuring in the ED today.  Unknown cause of your symptoms.  Likely musculoskeletal.  Will try NSAIDs.  Can give you some muscle relaxers and an acids to help with the pain.  Follow-up with primary care doctor return to the ED with any worsening symptoms.

## 2017-10-06 NOTE — ED Provider Notes (Signed)
[facility] EMERGENCY DEPARTMENT Provider Note   CSN: [id number] Arrival date & time: 10/05/17  1715     History   Chief Complaint Chief Complaint  Patient presents with  . Chest Pain    HPI Ashlee Berger is a 42 y.o. female.  HPI 43 yo female with no sign pmh presents to the ED for eval of cp. CP has been ongoing fro 2 weeks. Pt is intermittent. Substernal and radiates to collar bone and back. Worse with movement. Reports some associated sob at time. Denies any nausea, vomiting, or diaphoresis. She does report some cough that is non productive. Pt states the pain is not exertional or pleuritic. Denies hx of pe/dvt, ocp use, tobacco use, prolonged immobilization, recent hospitalization/surgiers. She does report some left calf pain for the past 2 weeks intermittently without edema. Denies any cardiac hx. Reports family cardiac hx. Nothing makes pain better. She has tried over the counter medications without relief. No hx of same.   Pt denies any fever, chill, ha, vision changes, lightheadedness, dizziness, congestion, neck pain, abd pain, n/v/d, urinary symptoms, change in bowel habits, melena, hematochezia, lower extremity paresthesias.  Past Medical History:  Diagnosis Date  . GERD (gastroesophageal reflux disease)     There are no active problems to display for this patient.   History reviewed. No pertinent surgical history.  OB History    No data available       Home Medications    Prior to Admission medications   Medication Sig Start Date End Date Taking? Authorizing Provider  cholecalciferol (VITAMIN D) 1000 units tablet Take 1,000 Units by mouth daily.   Yes [provider]  LISINOPRIL PO Take by mouth.    [provider]  methocarbamol (ROBAXIN) 500 MG tablet Take 1 tablet (500 mg total) by mouth 2 (two) times daily. 10/06/17   Rise Mu, PA-C  omeprazole (PRILOSEC) 20 MG capsule Take 1 capsule (20 mg total) by mouth daily.  10/06/17   Rise Mu, PA-C    Family History History reviewed. No pertinent family history.  Social History Social History   Tobacco Use  . Smoking status: Never Smoker  . Smokeless tobacco: Never Used  Substance Use Topics  . Alcohol use: No  . Drug use: No     Allergies   Patient has no known allergies.   Review of Systems Review of Systems  Constitutional: Negative for chills, diaphoresis and fever.  HENT: Negative for congestion.   Eyes: Negative for visual disturbance.  Respiratory: Positive for cough and shortness of breath.   Cardiovascular: Positive for chest pain. Negative for palpitations and leg swelling.  Gastrointestinal: Negative for abdominal pain, diarrhea, nausea and vomiting.  Genitourinary: Negative for dysuria, flank pain, frequency, hematuria and urgency.  Musculoskeletal: Positive for myalgias. Negative for arthralgias.  Skin: Negative for rash.  Neurological: Negative for dizziness, syncope, weakness, light-headedness, numbness and headaches.  Psychiatric/Behavioral: Negative for sleep disturbance. The patient is not nervous/anxious.      Physical Exam Updated Vital Signs BP 118/84 (BP Location: Left Arm)   Pulse 76   Temp 98.3 F (36.8 C) (Oral)   Resp 18   Ht 5\' 2"  (1.575 m)   Wt 80.3 kg (177 lb)   SpO2 100%   BMI 32.37 kg/m   Physical Exam  Constitutional: She is oriented to person, place, and time. She appears well-developed and well-nourished.  Non-toxic appearance. No distress.  HENT:  Head: Normocephalic and atraumatic.  Nose: Nose normal.  Mouth/Throat: Oropharynx is clear and moist.  Eyes: Conjunctivae are normal. Pupils are equal, round, and reactive to light. Right eye exhibits no discharge. Left eye exhibits no discharge.  Neck: Normal range of motion. Neck supple. No JVD present. No tracheal deviation present.  Cardiovascular: Normal rate, regular rhythm, normal heart sounds and intact distal pulses. Exam reveals  no gallop and no friction rub.  No murmur heard. Pulmonary/Chest: Effort normal and breath sounds normal. No stridor. No respiratory distress. She has no wheezes. She has no rales. She exhibits tenderness.  No hypoxia or tachypnea.  Abdominal: Soft. Bowel sounds are normal. She exhibits no distension. There is no tenderness. There is no rebound and no guarding.  Musculoskeletal: Normal range of motion.       Right lower leg: Normal. She exhibits no edema.       Left lower leg: Normal. She exhibits no edema.  No lower extremity edema or calf tenderness.  Dp pulses 2+ bilat. Brisk cap refill. Sensation intact.  Lymphadenopathy:    She has no cervical adenopathy.  Neurological: She is alert and oriented to person, place, and time.  Skin: Skin is warm and dry. Capillary refill takes less than 2 seconds. She is not diaphoretic.  Psychiatric: Her behavior is normal. Judgment and thought content normal.  Nursing note and vitals reviewed.    ED Treatments / Results  Labs (all labs ordered are listed, but only abnormal results are displayed) Labs Reviewed  BASIC METABOLIC PANEL - Abnormal; Notable for the following components:      Result Value   Potassium 3.3 (*)    All other components within normal limits  CBC - Abnormal; Notable for the following components:   RBC 3.59 (*)    Hemoglobin 11.1 (*)    HCT 33.1 (*)    All other components within normal limits  TROPONIN I  PREGNANCY, URINE  D-DIMER, QUANTITATIVE (NOT AT [facility])  TROPONIN I    EKG  EKG Interpretation  Date/Time:  Saturday October 05 2017 17:21:09 EST Ventricular Rate:  91 PR Interval:  114 QRS Duration: 84 QT Interval:  336 QTC Calculation: 413 R Axis:   11 Text Interpretation:  Normal sinus rhythm Normal ECG When compared to prior, new q wave in lead 3.  No STEMI Confirmed by Theda Belfastegeler, Chris ([id number]) on 10/05/2017 7:54:12 PM       Radiology Dg Chest 2 View  Result Date: 10/05/2017 CLINICAL DATA:  Chest and  back pain for 1 month. EXAM: CHEST  2 VIEW COMPARISON:  Chest radiograph 06/16/2018 FINDINGS: The heart size and mediastinal contours are within normal limits. Both lungs are clear. The visualized skeletal structures are unremarkable. IMPRESSION: Normal chest. Electronically Signed   By: Deatra RobinsonKevin  Herman M.D.   On: 10/05/2017 19:28   [place] Venous Img Lower Unilateral Left  Result Date: 10/05/2017 CLINICAL DATA:  Left leg pain and swelling for 1 month. EXAM: LEFT LOWER EXTREMITY VENOUS DOPPLER ULTRASOUND TECHNIQUE: Gray-scale sonography with graded compression, as well as color Doppler and duplex ultrasound were performed to evaluate the lower extremity deep venous systems from the level of the common femoral vein and including the common femoral, femoral, profunda femoral, popliteal and calf veins including the posterior tibial, peroneal and gastrocnemius veins when visible. The superficial great saphenous vein was also interrogated. Spectral Doppler was utilized to evaluate flow at rest and with distal augmentation maneuvers in the common femoral, femoral and popliteal veins. COMPARISON:  None. FINDINGS: Contralateral Common Femoral Vein: Respiratory phasicity  is normal and symmetric with the symptomatic side. No evidence of thrombus. Normal compressibility. Common Femoral Vein: No evidence of thrombus. Normal compressibility, respiratory phasicity and response to augmentation. Saphenofemoral Junction: No evidence of thrombus. Normal compressibility and flow on color Doppler imaging. Profunda Femoral Vein: No evidence of thrombus. Normal compressibility and flow on color Doppler imaging. Femoral Vein: No evidence of thrombus. Normal compressibility, respiratory phasicity and response to augmentation. Popliteal Vein: No evidence of thrombus. Normal compressibility, respiratory phasicity and response to augmentation. Calf Veins: No evidence of thrombus. Normal compressibility and flow on color Doppler imaging.  Superficial Great Saphenous Vein: No evidence of thrombus. Normal compressibility. Venous Reflux:  None. Other Findings:  None. IMPRESSION: No evidence of deep venous thrombosis. Electronically Signed   By: Myles Rosenthal M.D.   On: 10/05/2017 21:22    Procedures Procedures (including critical care time)  Medications Ordered in ED Medications  gi cocktail (Maalox,Lidocaine,Donnatal) (30 mLs Oral Given 10/05/17 2133)  ketorolac (TORADOL) 30 MG/ML injection 15 mg (15 mg Intravenous Given 10/05/17 2244)     Initial Impression / Assessment and Plan / ED Course  I have reviewed the triage vital signs and the nursing notes.  Pertinent labs & imaging results that were available during my care of the patient were reviewed by me and considered in my medical decision making (see chart for details).     Pt presents to the Ed today with complaints of cp. Patient is to be discharged with recommendation to follow up with PCP in regards to today's hospital visit. Chest pain is not likely of cardiac or pulmonary etiology d/t presentation, d-dimer negative, VSS, no tracheal deviation, no JVD or new murmur, RRR, breath sounds equal bilaterally, EKG without any change from prior tracing and shows no signs of ischemia, negative delta troponin, and negative CXR. Heart pathway score 1. Very atypical for acs. Clinical presentation not consistent with pe or dissection. Pt has been advised to return to the ED is CP becomes exertional, associated with diaphoresis or nausea, radiates to left jaw/arm, worsens or becomes concerning in any way. Pt appears reliable for follow up and is agreeable to discharge.       Final Clinical Impressions(s) / ED Diagnoses   Final diagnoses:  Nonspecific chest pain    ED Discharge Orders        Ordered    methocarbamol (ROBAXIN) 500 MG tablet  2 times daily     10/06/17 0010    omeprazole (PRILOSEC) 20 MG capsule  Daily     10/06/17 0010       Rise Mu,  PA-C 10/06/17 1610    Tegeler, Canary Brim, MD 10/07/17 [phone]

## 2019-05-02 IMAGING — US US ABDOMEN LIMITED
1 series · 14 of 25 positions shown · non-contrast
Comparison: 03/15/2017

CLINICAL DATA: Right upper quadrant pain, nausea

EXAM:
ULTRASOUND ABDOMEN LIMITED RIGHT UPPER QUADRANT

[Series 1: us abdomen limited · 0.20mm/px · 14 of 29 slices shown]
[im 1/29]
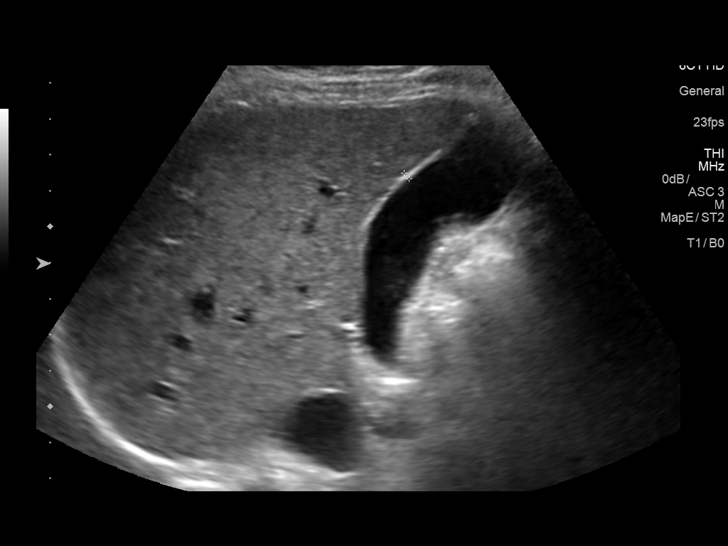
[im 3/29]
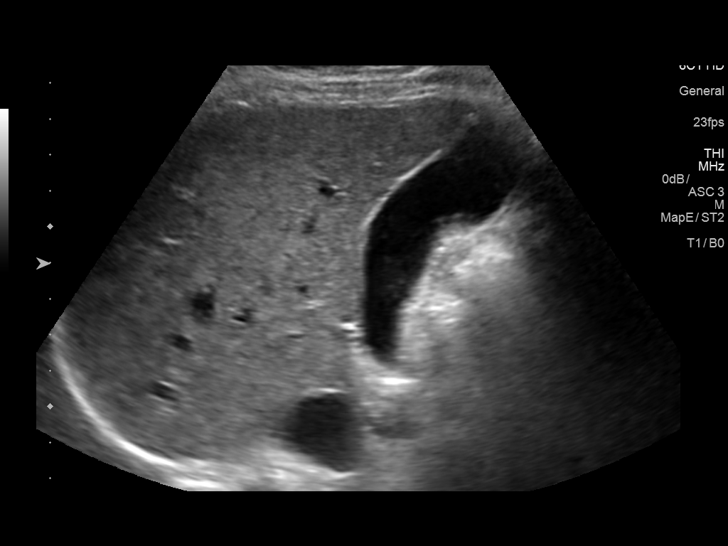
[im 5/29]
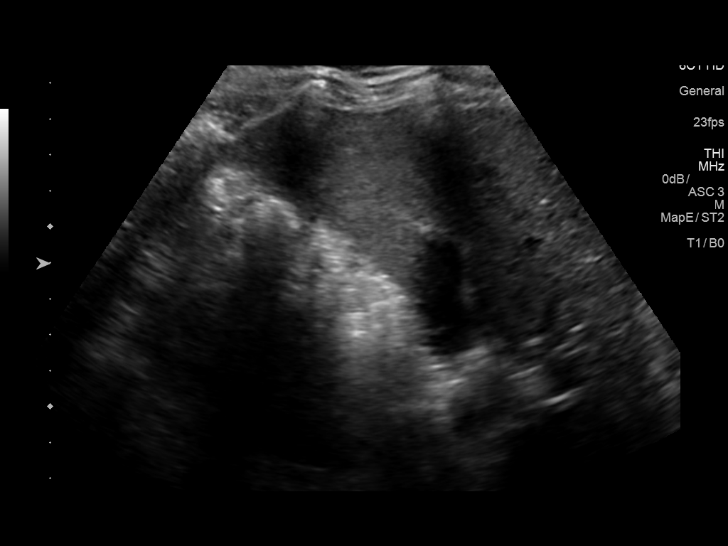
[im 8/29]
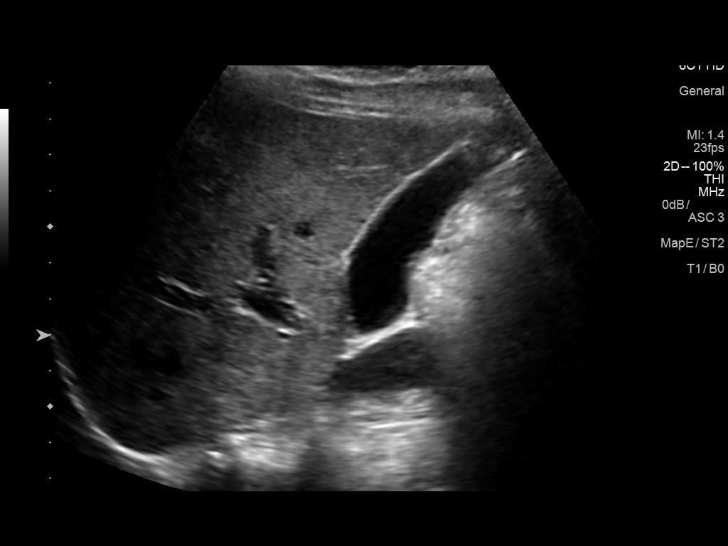
[im 10/29]
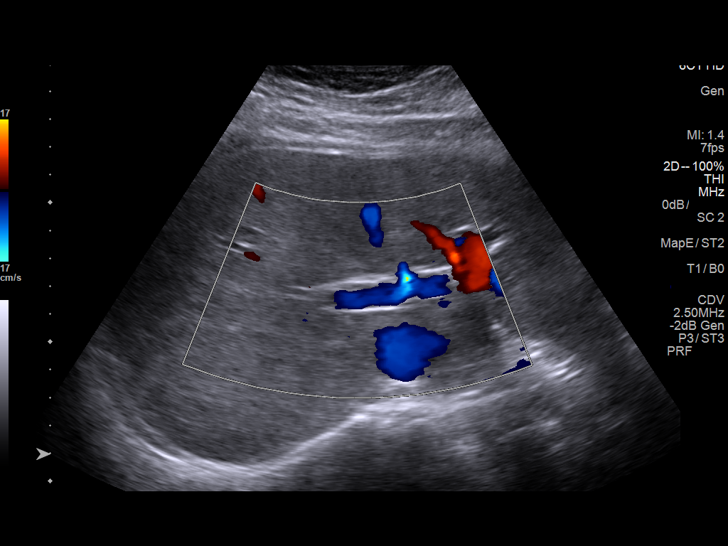
[im 11/29]
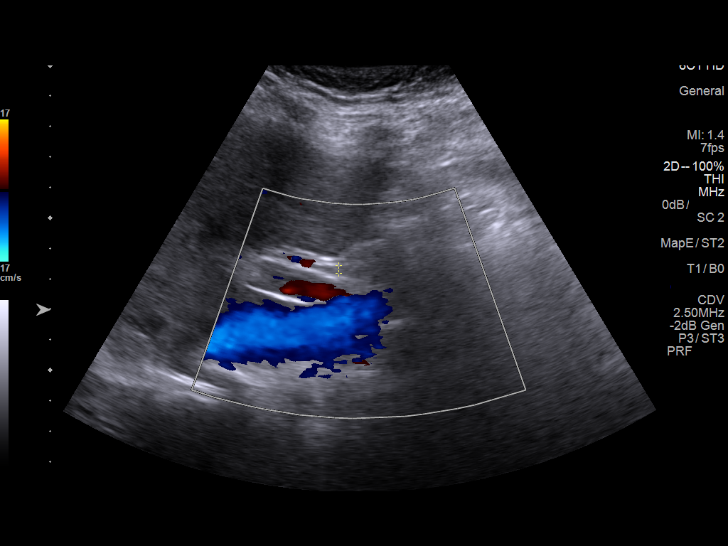
[im 13/29]
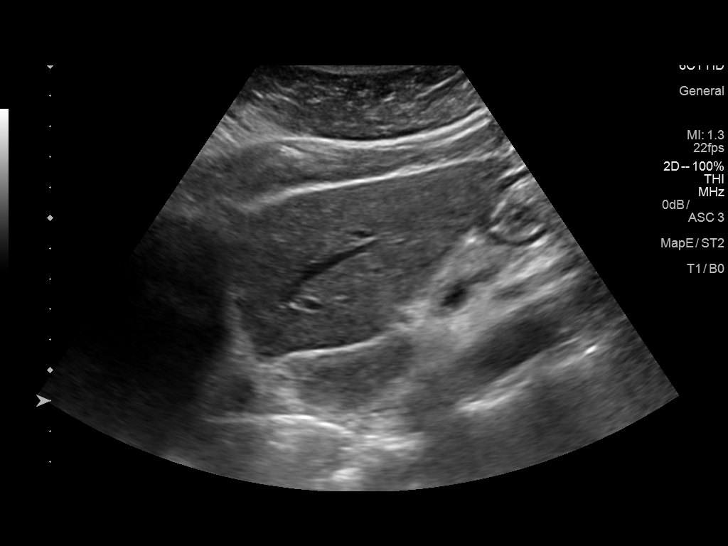
[im 16/29]
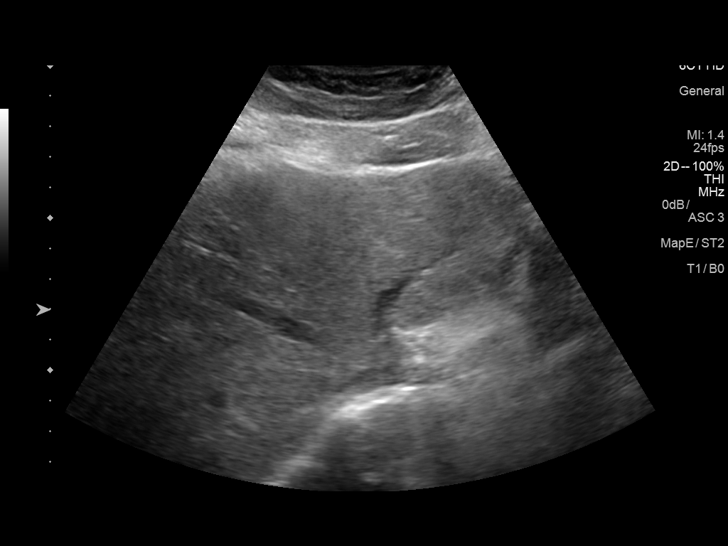
[im 18/29]
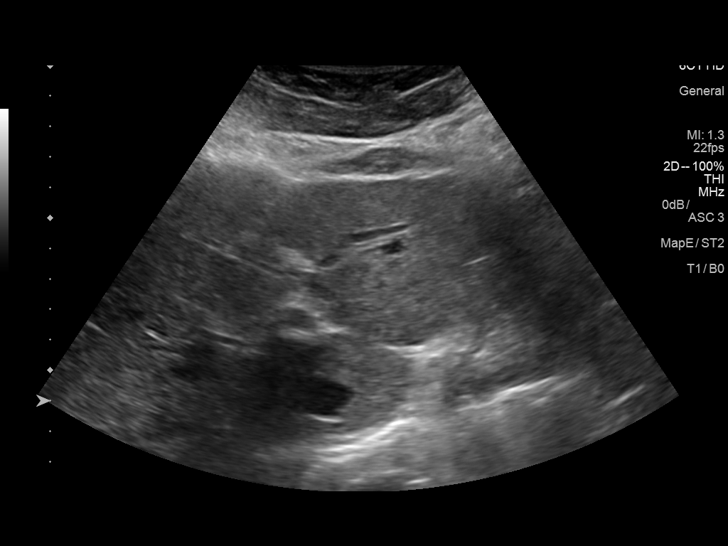
[im 19/29]
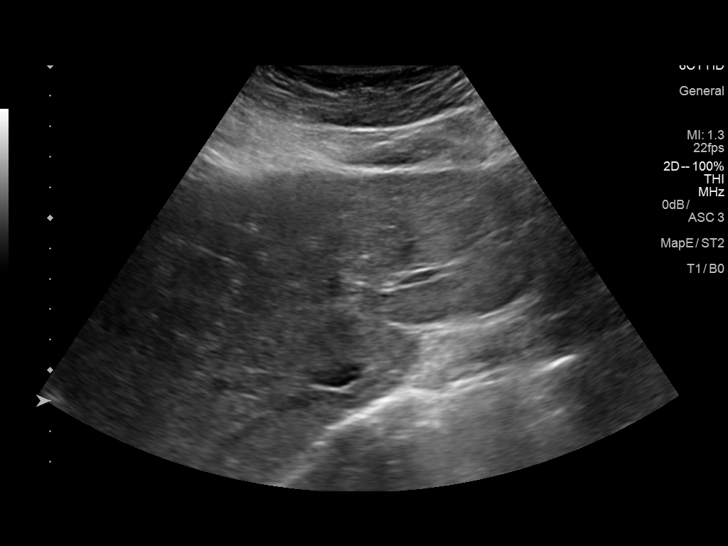
[im 22/29]
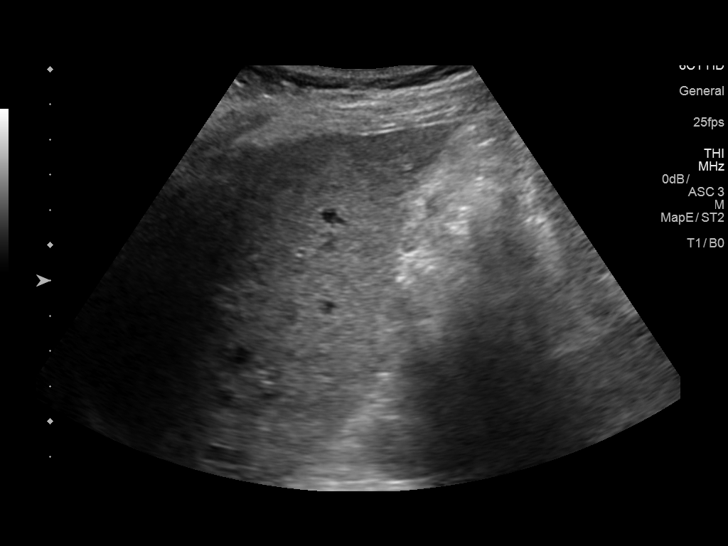
[im 24/29]
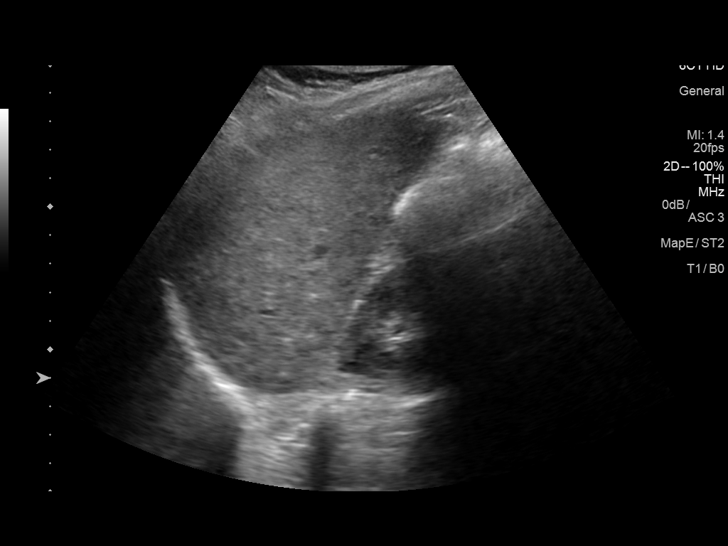
[im 26/29]
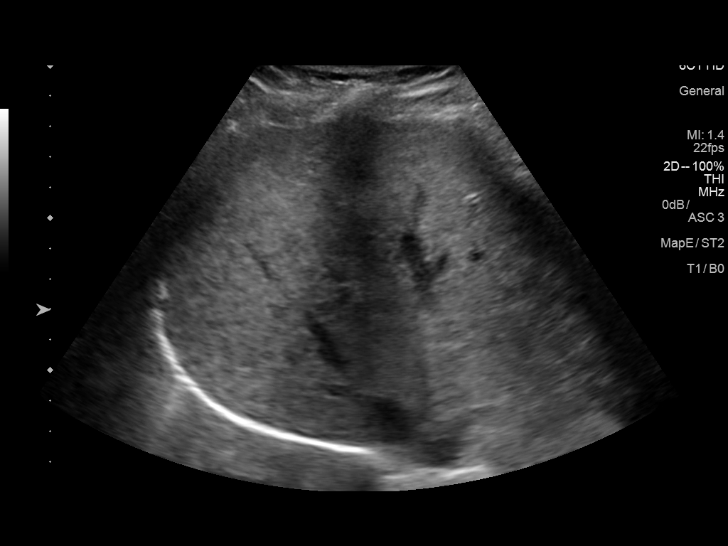
[im 29/29]
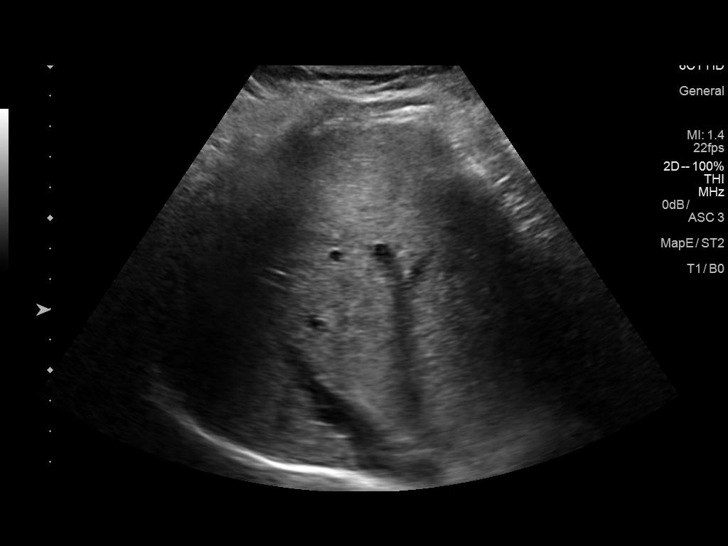

[14 of 25 positions shown; findings below may reference images not displayed]

FINDINGS: Gallbladder:

No gallstones or wall thickening visualized. No sonographic Murphy
sign noted by sonographer.

Common bile duct:

Diameter: Normal caliber, 3 mm

Liver:

Heterogeneous echotexture without focal abnormality. Portal vein is
patent on color Doppler imaging with normal direction of blood flow
towards the liver.
IMPRESSION: No acute findings.  No evidence of cholelithiasis or cholecystitis.

## 2019-09-03 IMAGING — US US EXTREM LOW VENOUS*L*
1 series · 13 of 24 positions shown · non-contrast
Comparison: None.

CLINICAL DATA: Left leg pain and swelling for 1 month.



[Series 1: us extrem low venous*left* · 0.06mm/px · 13 of 28 slices shown]
[im 1/28]
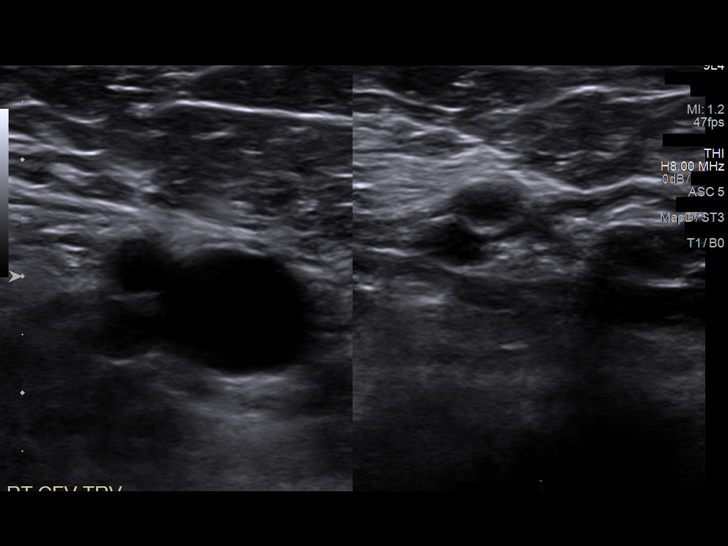
[im 3/28]
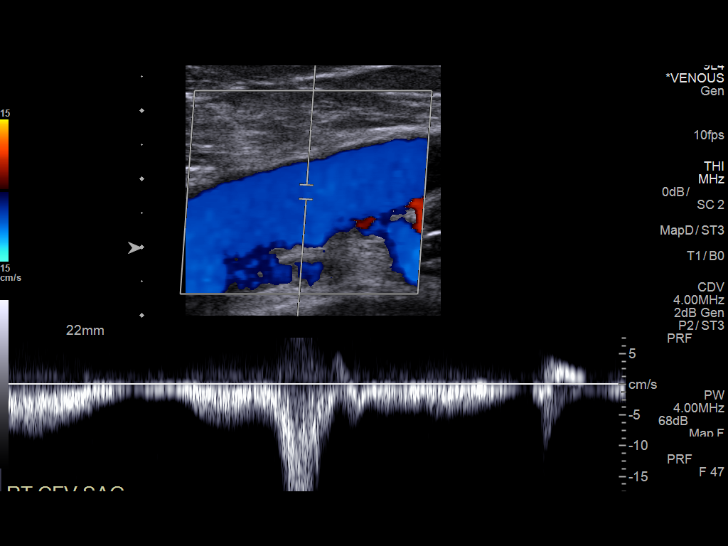
[im 5/28]
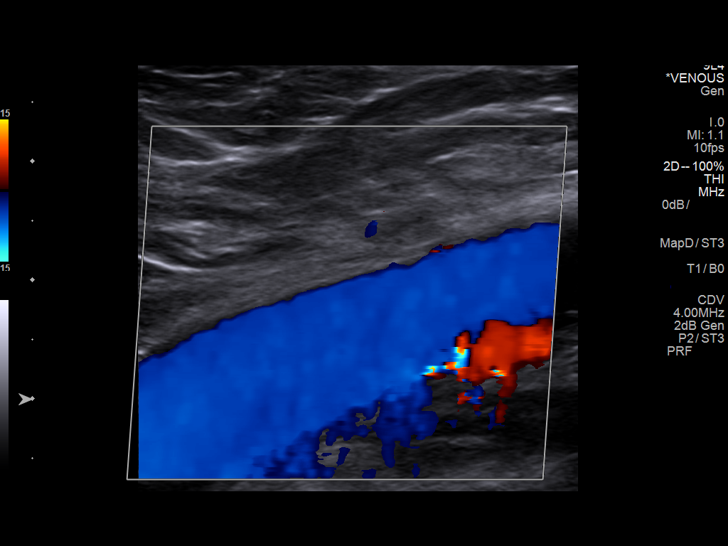
[im 8/28]
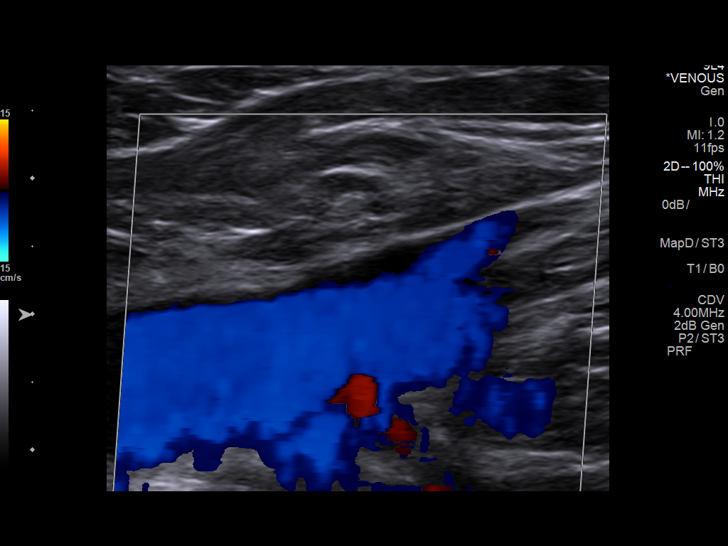
[im 10/28]
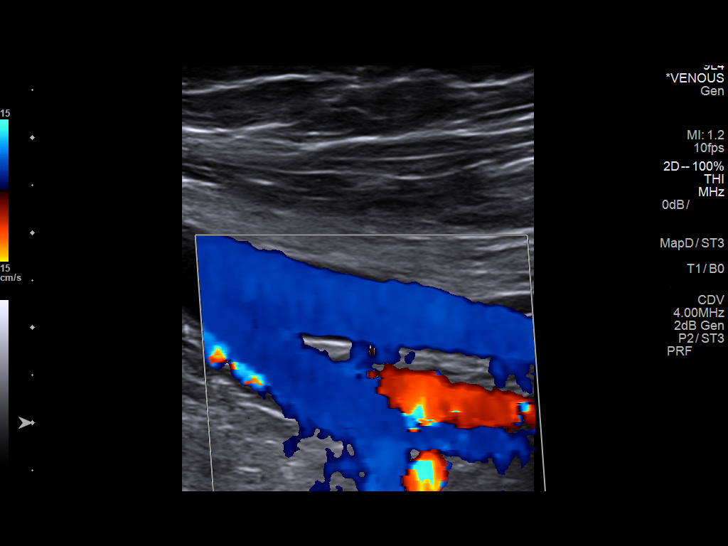
[im 12/28]
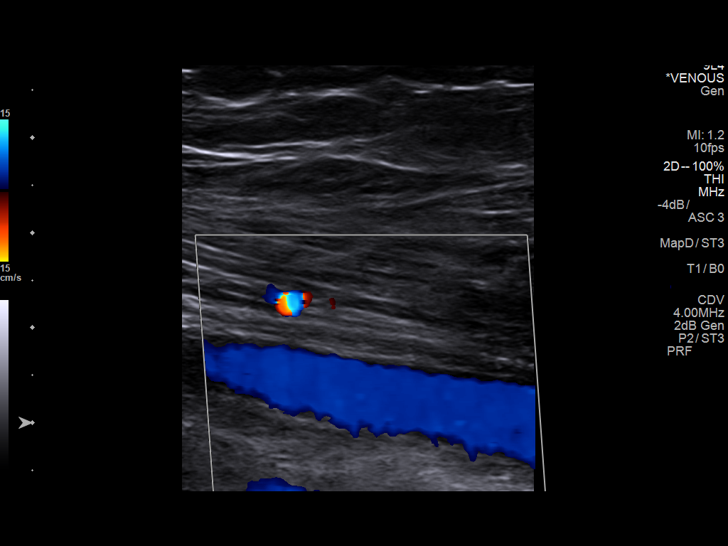
[im 15/28]
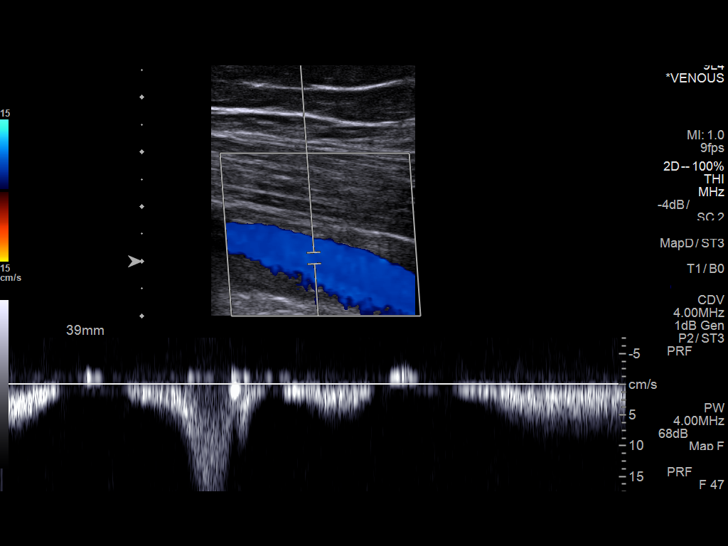
[im 16/28]
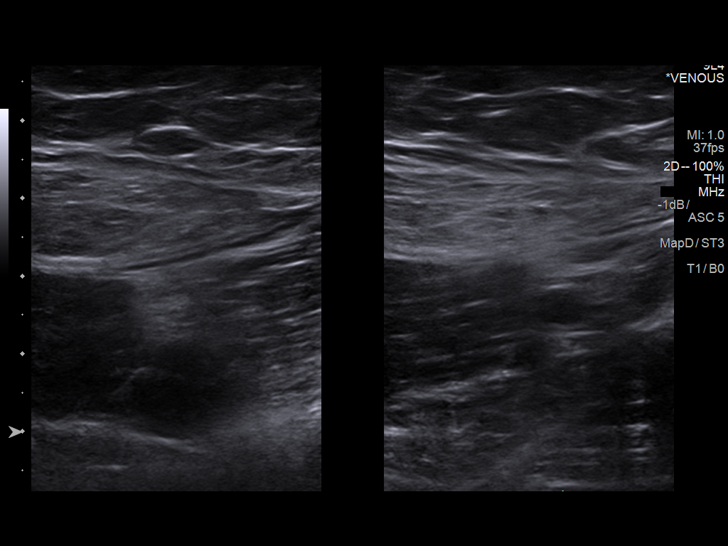
[im 18/28]
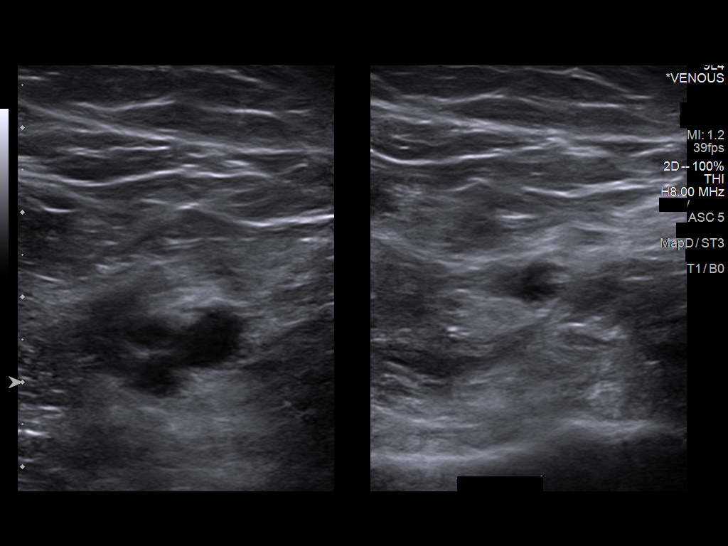
[im 20/28]
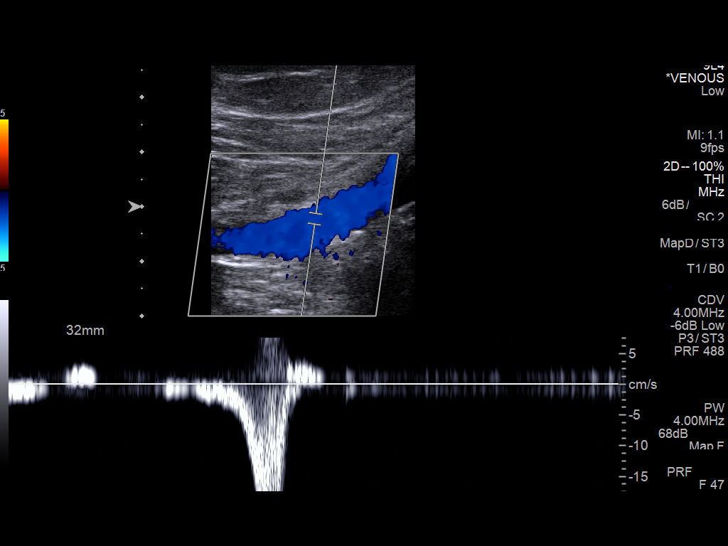
[im 23/28]
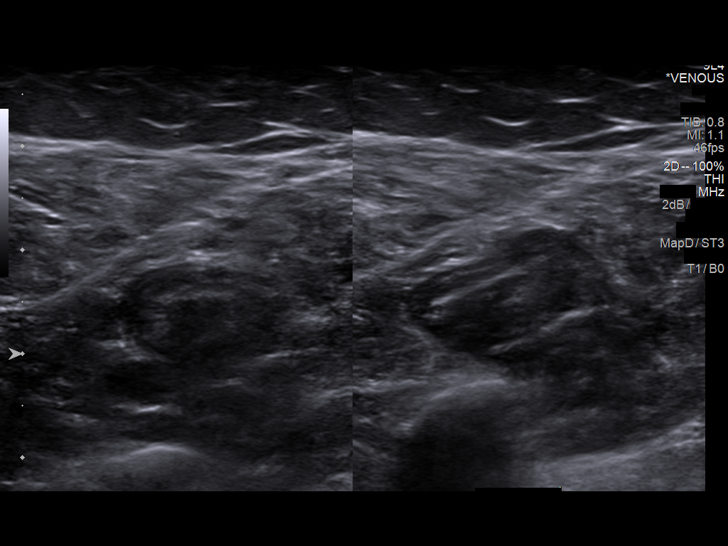
[im 25/28]
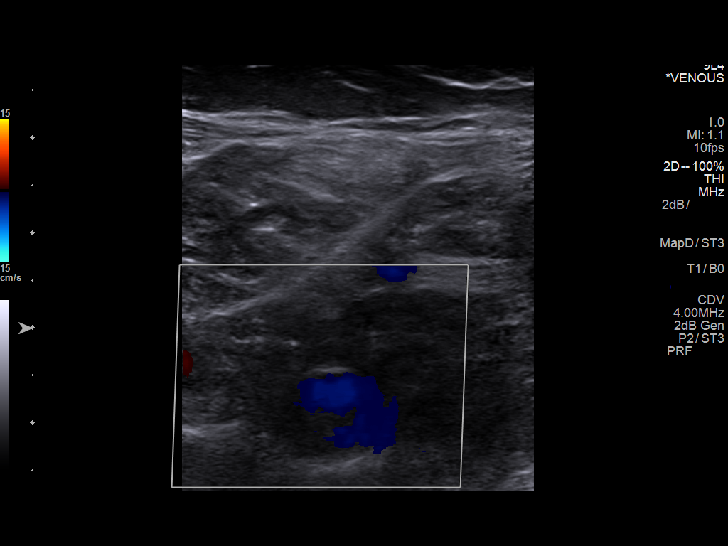
[im 28/28]
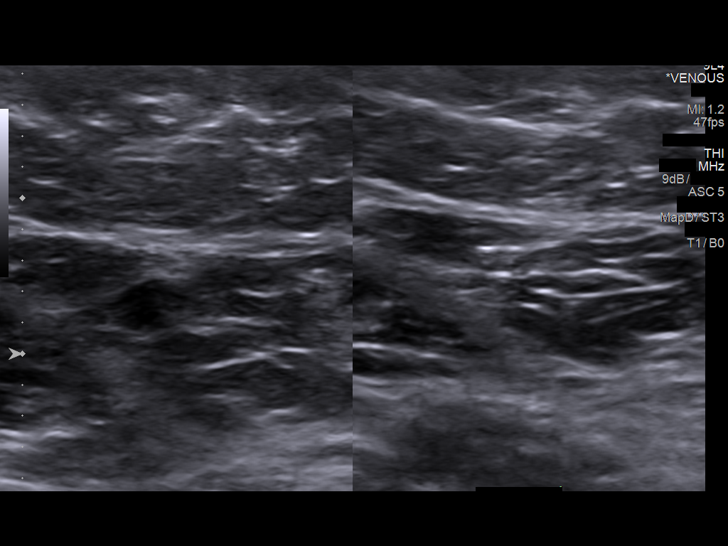

[13 of 24 positions shown; findings below may reference images not displayed]

FINDINGS: Contralateral Common Femoral Vein: Respiratory phasicity is normal
and symmetric with the symptomatic side. No evidence of thrombus.
Normal compressibility.

Common Femoral Vein: No evidence of thrombus. Normal
compressibility, respiratory phasicity and response to augmentation.

Saphenofemoral Junction: No evidence of thrombus. Normal
compressibility and flow on color Doppler imaging.

Profunda Femoral Vein: No evidence of thrombus. Normal
compressibility and flow on color Doppler imaging.

Femoral Vein: No evidence of thrombus. Normal compressibility,
respiratory phasicity and response to augmentation.

Popliteal Vein: No evidence of thrombus. Normal compressibility,
respiratory phasicity and response to augmentation.

Calf Veins: No evidence of thrombus. Normal compressibility and flow
on color Doppler imaging.

Superficial Great Saphenous Vein: No evidence of thrombus. Normal
compressibility.

Venous Reflux:  None.

Other Findings:  None.
IMPRESSION: No evidence of deep venous thrombosis.

## 2019-10-28 IMAGING — CR DG CHEST 2V
2 series · 2 of 2 positions shown · non-contrast
Comparison: Radiographs May 16, 2017.

CLINICAL DATA: Chest pain.

EXAM:
CHEST  2 VIEW

[w chest pa]
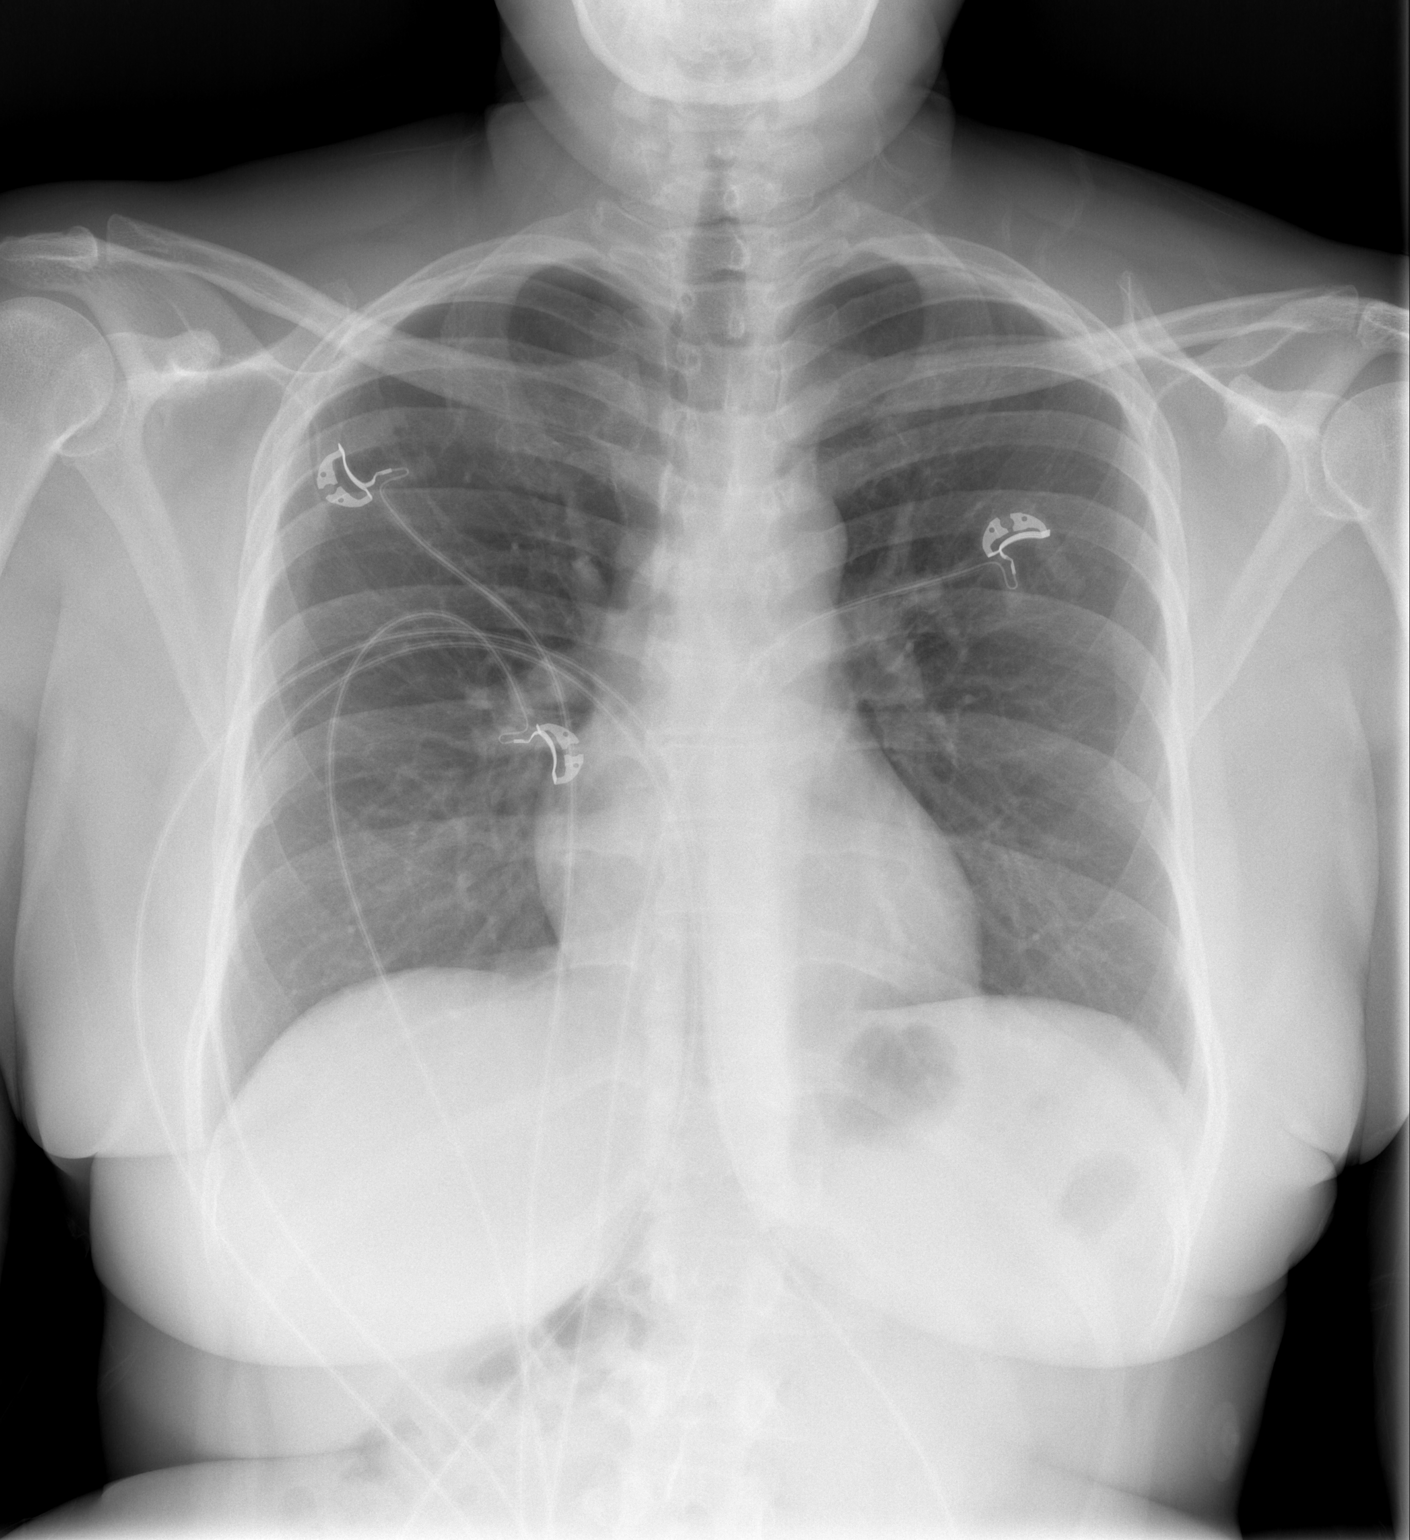

[w chest lat]
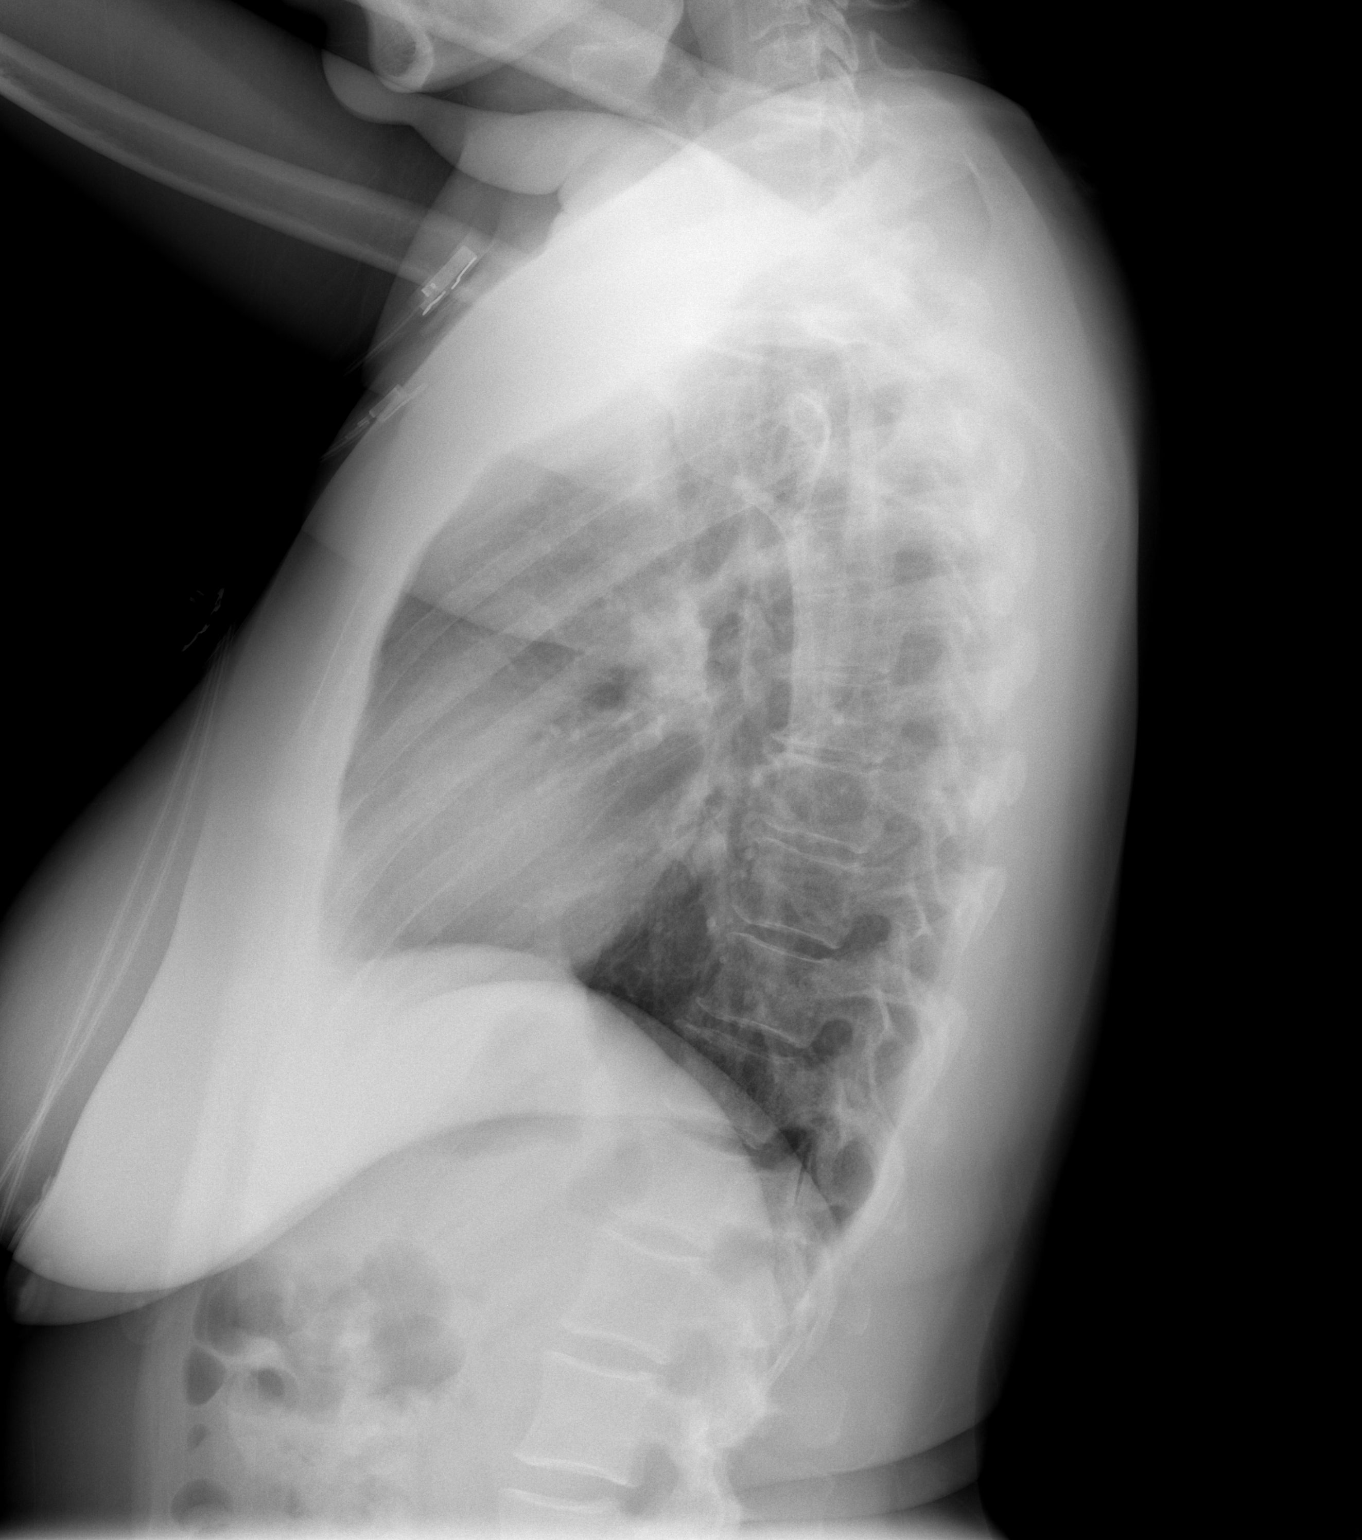

[2 of 2 positions shown; findings below may reference images not displayed]

FINDINGS: The heart size and mediastinal contours are within normal limits.
Both lungs are clear. No pneumothorax or pleural effusion is noted.
The visualized skeletal structures are unremarkable.
IMPRESSION: No active cardiopulmonary disease.

## 2020-03-21 ENCOUNTER — Other Ambulatory Visit: Payer: Self-pay

## 2020-03-21 ENCOUNTER — Emergency Department (HOSPITAL_BASED_OUTPATIENT_CLINIC_OR_DEPARTMENT_OTHER): Payer: BC Managed Care – PPO

## 2020-03-21 ENCOUNTER — Encounter (HOSPITAL_BASED_OUTPATIENT_CLINIC_OR_DEPARTMENT_OTHER): Payer: Self-pay | Admitting: *Deleted

## 2020-03-21 DIAGNOSIS — R030 Elevated blood-pressure reading, without diagnosis of hypertension: Secondary | ICD-10-CM | POA: Diagnosis present

## 2020-03-21 DIAGNOSIS — I10 Essential (primary) hypertension: Secondary | ICD-10-CM | POA: Insufficient documentation

## 2020-03-21 DIAGNOSIS — R0789 Other chest pain: Secondary | ICD-10-CM | POA: Insufficient documentation

## 2020-03-21 LAB — TROPONIN I (HIGH SENSITIVITY): Troponin I (High Sensitivity): <2 ng/L (ref <18)

## 2020-03-21 LAB — CBC
HCT: 36.7 % (ref 36.0–46.0)
Hemoglobin: 12 g/dL (ref 12.0–15.0)
MCH: 30.5 pg (ref 26.0–34.0)
MCHC: 32.7 g/dL (ref 30.0–36.0)
MCV: 93.1 fL (ref 80.0–100.0)
Platelets: 339 10*3/uL (ref 150–400)
RBC: 3.94 MIL/uL (ref 3.87–5.11)
RDW: 12 % (ref 11.5–15.5)
WBC: 7.3 10*3/uL (ref 4.0–10.5)
nRBC: 0 % (ref 0.0–0.2)

## 2020-03-21 LAB — BASIC METABOLIC PANEL
Anion gap: 10 (ref 5–15)
BUN: 18 mg/dL (ref 6–20)
CO2: 23 mmol/L (ref 22–32)
Calcium: 8.8 mg/dL — ABNORMAL LOW (ref 8.9–10.3)
Chloride: 102 mmol/L (ref 98–111)
Creatinine, Ser: 0.93 mg/dL (ref 0.44–1.00)
GFR calc Af Amer: >60 mL/min (ref >60)
GFR calc non Af Amer: >60 mL/min (ref >60)
Glucose, Bld: 102 mg/dL — ABNORMAL HIGH (ref 70–99)
Potassium: 3.5 mmol/L (ref 3.5–5.1)
Sodium: 135 mmol/L (ref 135–145)

## 2020-03-21 MED ORDER — SODIUM CHLORIDE 0.9% FLUSH
3.0000 mL | Freq: Once | INTRAVENOUS | Status: DC
  Filled 2020-03-21: qty 3

## 2020-03-21 NOTE — ED Triage Notes (Signed)
Chest pain since this am that feels like reflux. She took her Protonix with some temporary relief.

## 2020-03-22 ENCOUNTER — Emergency Department (HOSPITAL_BASED_OUTPATIENT_CLINIC_OR_DEPARTMENT_OTHER)
Admission: EM | Admit: 2020-03-22 | Discharge: 2020-03-22 | Disposition: A | Discharge status: Home / Self Care | Payer: BC Managed Care – PPO | Attending: Emergency Medicine | Admitting: Emergency Medicine

## 2020-03-22 DIAGNOSIS — I10 Essential (primary) hypertension: Secondary | ICD-10-CM

## 2020-03-22 DIAGNOSIS — R0789 Other chest pain: Secondary | ICD-10-CM

## 2020-03-22 NOTE — Discharge Instructions (Addendum)
You were seen today with concerns for high blood pressure.  Keep a log of your blood pressures over the next month.  Take to your primary care physician for adjustments in your medications.

## 2020-03-22 NOTE — ED Notes (Signed)
ED Provider at bedside. 

## 2020-03-22 NOTE — ED Provider Notes (Signed)
[facility] EMERGENCY DEPARTMENT Provider Note   CSN: [id number] Arrival date & time: 03/21/20  2118     History Chief Complaint  Patient presents with  . Chest Pain    Ashlee Berger is a 44 y.o. female.  HPI     This a 44 year old female with a history of high blood pressure who presents with concerns for high blood pressure.  Patient reports that she had a slight headache at home.  She took her blood pressure noted to be 150s over 100s.  She states that normally her blood pressure is within a "normal range."  She takes lisinopril and reports compliance.  She states that she has had an occasional chest pain.  It is anterior nonradiating.  It is worse with movement and she thinks she may have pulled a muscle.  No fevers, cough, upper respiratory symptoms.  No known sick contacts or Covid exposures.  She denies weakness, numbness, tingling, strokelike symptoms.  Past Medical History:  Diagnosis Date  . GERD (gastroesophageal reflux disease)     There are no problems to display for this patient.   Past Surgical History:  Procedure Laterality Date  . TUBAL LIGATION       OB History   No obstetric history on file.     No family history on file.  Social History   Tobacco Use  . Smoking status: Never Smoker  . Smokeless tobacco: Never Used  Substance Use Topics  . Alcohol use: Yes  . Drug use: No    Home Medications Prior to Admission medications   Medication Sig Start Date End Date Taking? Authorizing Provider  cholecalciferol (VITAMIN D) 1000 units tablet Take 1,000 Units by mouth daily.   Yes [provider]  LISINOPRIL PO Take by mouth.   Yes [provider]  pantoprazole (PROTONIX) 40 MG tablet pantoprazole 40 mg tablet,delayed release 11/18/18  Yes [provider]  atorvastatin (LIPITOR) 10 MG tablet Take 10 mg by mouth at bedtime. 01/17/20   [provider]  ergocalciferol (VITAMIN D2) 1.25 MG (50000 UT) capsule  ergocalciferol (vitamin D2) 1,250 mcg (50,000 unit) capsule    [provider]  methocarbamol (ROBAXIN) 500 MG tablet Take 1 tablet (500 mg total) by mouth 2 (two) times daily. 10/06/17   Rise Mu, PA-C  omeprazole (PRILOSEC) 20 MG capsule Take 1 capsule (20 mg total) by mouth daily. 10/06/17   Rise Mu, PA-C    Allergies    Patient has no known allergies.  Review of Systems   Review of Systems  Constitutional: Negative for fever.  Respiratory: Negative for shortness of breath.   Cardiovascular: Positive for chest pain.  Gastrointestinal: Negative for abdominal pain, nausea and vomiting.  Neurological: Positive for headaches. Negative for weakness and numbness.  All other systems reviewed and are negative.   Physical Exam Updated Vital Signs BP (!) 137/103   Pulse 72   Temp 98.2 F (36.8 C) (Oral)   Resp 15   Ht 1.575 m (5\' 2" )   Wt 96.9 kg   SpO2 100%   BMI 39.09 kg/m   Physical Exam Vitals and nursing note reviewed.  Constitutional:      Appearance: She is well-developed. She is not ill-appearing.  HENT:     Head: Normocephalic and atraumatic.  Eyes:     Pupils: Pupils are equal, round, and reactive to light.  Cardiovascular:     Rate and Rhythm: Normal rate and regular rhythm.  Heart sounds: Normal heart sounds.  Pulmonary:     Effort: Pulmonary effort is normal. No respiratory distress.     Breath sounds: No wheezing.  Chest:     Chest wall: Tenderness present.  Abdominal:     Palpations: Abdomen is soft.     Tenderness: There is no guarding or rebound.  Musculoskeletal:     Cervical back: Neck supple.     Right lower leg: No tenderness.     Left lower leg: No tenderness.  Skin:    General: Skin is warm and dry.  Neurological:     Mental Status: She is alert and oriented to person, place, and time.  Psychiatric:        Mood and Affect: Mood normal.     ED Results / Procedures / Treatments   Labs (all labs ordered are  listed, but only abnormal results are displayed) Labs Reviewed  BASIC METABOLIC PANEL - Abnormal; Notable for the following components:      Result Value   Glucose, Bld 102 (*)    Calcium 8.8 (*)    All other components within normal limits  CBC  TROPONIN I (HIGH SENSITIVITY)  TROPONIN I (HIGH SENSITIVITY)    EKG EKG Interpretation  Date/Time:  Monday March 21 2020 21:31:27 EDT Ventricular Rate:  83 PR Interval:  112 QRS Duration: 86 QT Interval:  354 QTC Calculation: 415 R Axis:   27 Text Interpretation: Normal sinus rhythm Normal ECG Confirmed by Ross Marcus ([id number]) on 03/22/2020 1:41:50 AM   Radiology DG Chest 2 View  Result Date: 03/21/2020 CLINICAL DATA:  Chest pain EXAM: CHEST - 2 VIEW COMPARISON:  10/05/2017 FINDINGS: The heart size and mediastinal contours are within normal limits. Both lungs are clear. The visualized skeletal structures are unremarkable. IMPRESSION: Normal study. Electronically Signed   By: Charlett Nose M.D.   On: 03/21/2020 21:53    Procedures Procedures (including critical care time)  Medications Ordered in ED Medications  sodium chloride flush (NS) 0.9 % injection 3 mL (3 mLs Intravenous Not Given 03/22/20 0123)    ED Course  I have reviewed the triage vital signs and the nursing notes.  Pertinent labs & imaging results that were available during my care of the patient were reviewed by me and considered in my medical decision making (see chart for details).    MDM Rules/Calculators/A&P                           Patient presents with concerns for high blood pressure.  She is overall nontoxic and vital signs are reassuring.  Blood pressure is 137/103.  She has had a slight headache which is improved and occasional chest pain which is reproducible on exam.  She is low risk with a heart score 1.  EKG shows no evidence of acute ischemia or arrhythmia.  Chest x-ray without pneumothorax or pneumonia.  Basic lab work is reassuring and one  troponin is negative.  Feel this is reasonable for rule out at this time given low risk factors and reproducible nature of pain.  Patient was reassured.  No evidence of hypertensive urgency or emergency.  We discussed taking a daily log of her blood pressures to review with her primary physician for adjustments in blood pressure medication.  After history, exam, and medical workup I feel the patient has been appropriately medically screened and is safe for discharge home. Pertinent diagnoses were discussed with the patient. Patient was  given return precautions.   Final Clinical Impression(s) / ED Diagnoses Final diagnoses:  Essential hypertension  Atypical chest pain    Rx / DC Orders ED Discharge Orders    None       Shon Baton, MD 03/22/20 [phone]

## 2023-03-07 ENCOUNTER — Other Ambulatory Visit: Payer: Self-pay

## 2023-03-07 ENCOUNTER — Emergency Department (HOSPITAL_BASED_OUTPATIENT_CLINIC_OR_DEPARTMENT_OTHER): Payer: BC Managed Care – PPO

## 2023-03-07 ENCOUNTER — Emergency Department (HOSPITAL_BASED_OUTPATIENT_CLINIC_OR_DEPARTMENT_OTHER)
Admission: EM | Admit: 2023-03-07 | Discharge: 2023-03-07 | Disposition: A | Discharge status: Home / Self Care | Payer: BC Managed Care – PPO | Attending: Emergency Medicine | Admitting: Emergency Medicine

## 2023-03-07 ENCOUNTER — Encounter (HOSPITAL_BASED_OUTPATIENT_CLINIC_OR_DEPARTMENT_OTHER): Payer: Self-pay

## 2023-03-07 DIAGNOSIS — Z79899 Other long term (current) drug therapy: Secondary | ICD-10-CM | POA: Insufficient documentation

## 2023-03-07 DIAGNOSIS — R0789 Other chest pain: Secondary | ICD-10-CM | POA: Diagnosis present

## 2023-03-07 DIAGNOSIS — I10 Essential (primary) hypertension: Secondary | ICD-10-CM | POA: Diagnosis not present

## 2023-03-07 LAB — CBC
HCT: 31.5 % — ABNORMAL LOW (ref 36.0–46.0)
Hemoglobin: 10.4 g/dL — ABNORMAL LOW (ref 12.0–15.0)
MCH: 30.5 pg (ref 26.0–34.0)
MCHC: 33 g/dL (ref 30.0–36.0)
MCV: 92.4 fL (ref 80.0–100.0)
Platelets: 351 10*3/uL (ref 150–400)
RBC: 3.41 MIL/uL — ABNORMAL LOW (ref 3.87–5.11)
RDW: 12.6 % (ref 11.5–15.5)
WBC: 7 10*3/uL (ref 4.0–10.5)
nRBC: 0 % (ref 0.0–0.2)

## 2023-03-07 LAB — BASIC METABOLIC PANEL
Anion gap: 9 (ref 5–15)
BUN: 15 mg/dL (ref 6–20)
CO2: 25 mmol/L (ref 22–32)
Calcium: 8.8 mg/dL — ABNORMAL LOW (ref 8.9–10.3)
Chloride: 104 mmol/L (ref 98–111)
Creatinine, Ser: 1.09 mg/dL — ABNORMAL HIGH (ref 0.44–1.00)
GFR, Estimated: >60 mL/min (ref >60)
Glucose, Bld: 87 mg/dL (ref 70–99)
Potassium: 3.1 mmol/L — ABNORMAL LOW (ref 3.5–5.1)
Sodium: 138 mmol/L (ref 135–145)

## 2023-03-07 LAB — TROPONIN I (HIGH SENSITIVITY): Troponin I (High Sensitivity): 2 ng/L (ref <18)

## 2023-03-07 MED ORDER — ALUM & MAG HYDROXIDE-SIMETH 200-200-20 MG/5ML PO SUSP
15.0000 mL | Freq: Once | ORAL | Status: AC
  Administered 2023-03-07: 15 mL via ORAL
  Filled 2023-03-07: qty 30

## 2023-03-07 NOTE — Discharge Instructions (Signed)
Continue your acid reflux medicine.  Consider taking Tums as well with meals.  Please return if symptoms worsen.

## 2023-03-07 NOTE — ED Triage Notes (Signed)
Pt arrives with c/o chest pain that started today. Pt denies SOB or n/v.

## 2023-03-07 NOTE — ED Provider Notes (Signed)
[place] [facility] Provider Note   CSN: [id number] Arrival date & time: 03/07/23  2132     History  Chief Complaint  Patient presents with   Chest Pain    Ashlee Berger is a 47 y.o. female.  Patient here after some chest discomfort, indigestion burning in her chest after eating dinner tonight.  Started about 3 hours ago.  Mostly resolved now.  Took some Tums.  She is on PPI.  She denies any shortness of breath or cough or sputum production.  No recent surgery or travel.  Father had a heart attack at 4.  Not on any birth control.  She not having any abdominal pain nausea or vomiting.  She overall feels good but was concerned maybe something cardiac was going on.  She has a history of high cholesterol, high blood pressure.  No diabetes history.  The history is provided by the patient.       Home Medications Prior to Admission medications   Medication Sig Start Date End Date Taking? Authorizing Provider  atorvastatin (LIPITOR) 10 MG tablet Take 10 mg by mouth at bedtime. 01/17/20   [provider]  cholecalciferol (VITAMIN D) 1000 units tablet Take 1,000 Units by mouth daily.    [provider]  ergocalciferol (VITAMIN D2) 1.25 MG (50000 UT) capsule ergocalciferol (vitamin D2) 1,250 mcg (50,000 unit) capsule    [provider]  LISINOPRIL PO Take by mouth.    [provider]  methocarbamol (ROBAXIN) 500 MG tablet Take 1 tablet (500 mg total) by mouth 2 (two) times daily. 10/06/17   Rise Mu, PA-C  omeprazole (PRILOSEC) 20 MG capsule Take 1 capsule (20 mg total) by mouth daily. 10/06/17   Rise Mu, PA-C  pantoprazole (PROTONIX) 40 MG tablet pantoprazole 40 mg tablet,delayed release 11/18/18   [provider]      Allergies    Patient has no known allergies.    Review of Systems   Review of Systems  Physical Exam Updated Vital Signs BP (!) 143/106 (BP Location: Right Arm)    Pulse (!) 108   Temp 97.9 F (36.6 C) (Oral)   Resp 16   Wt 87.1 kg   SpO2 94%   BMI 35.12 kg/m  Physical Exam Vitals and nursing note reviewed.  Constitutional:      General: She is not in acute distress.    Appearance: She is well-developed. She is not ill-appearing.  HENT:     Head: Normocephalic and atraumatic.  Eyes:     Extraocular Movements: Extraocular movements intact.     Conjunctiva/sclera: Conjunctivae normal.     Pupils: Pupils are equal, round, and reactive to light.  Cardiovascular:     Rate and Rhythm: Normal rate and regular rhythm.     Pulses:          Radial pulses are 2+ on the right side and 2+ on the left side.     Heart sounds: Normal heart sounds. No murmur heard. Pulmonary:     Effort: Pulmonary effort is normal. No respiratory distress.     Breath sounds: Normal breath sounds. No decreased breath sounds, wheezing or rhonchi.  Abdominal:     Palpations: Abdomen is soft.     Tenderness: There is no abdominal tenderness.  Musculoskeletal:        General: No swelling. Normal range of motion.     Cervical back: Normal range of motion and neck supple.  Right lower leg: No edema.     Left lower leg: No edema.  Skin:    General: Skin is warm and dry.     Capillary Refill: Capillary refill takes less than 2 seconds.  Neurological:     General: No focal deficit present.     Mental Status: She is alert.  Psychiatric:        Mood and Affect: Mood normal.     ED Results / Procedures / Treatments   Labs (all labs ordered are listed, but only abnormal results are displayed) Labs Reviewed  BASIC METABOLIC PANEL - Abnormal; Notable for the following components:      Result Value   Potassium 3.1 (*)    Creatinine, Ser 1.09 (*)    Calcium 8.8 (*)    All other components within normal limits  CBC - Abnormal; Notable for the following components:   RBC 3.41 (*)    Hemoglobin 10.4 (*)    HCT 31.5 (*)    All other components within normal limits   TROPONIN I (HIGH SENSITIVITY)    EKG EKG Interpretation Date/Time:  Thursday March 07 2023 21:41:13 EDT Ventricular Rate:  94 PR Interval:  141 QRS Duration:  95 QT Interval:  344 QTC Calculation: 431 R Axis:   43  Text Interpretation: Sinus rhythm on Confirmed by Virgina Norfolk [phone]) on 03/07/2023 9:42:43 PM  Radiology DG Chest 2 View  Result Date: 03/07/2023 CLINICAL DATA:  Chest pain onset today. EXAM: CHEST - 2 VIEW COMPARISON:  PA Lat 03/21/2020 FINDINGS: The heart size and mediastinal contours are within normal limits. Both lungs are clear. The visualized skeletal structures are unremarkable. IMPRESSION: No active cardiopulmonary disease.  Unchanged. Electronically Signed   By: Almira Bar M.D.   On: 03/07/2023 22:07    Procedures Procedures    Medications Ordered in ED Medications  alum & mag hydroxide-simeth (MAALOX/MYLANTA) 200-200-20 MG/5ML suspension 15 mL (has no administration in time range)    ED Course/ Medical Decision Making/ A&P             HEART Score: 2                Medical Decision Making Amount and/or Complexity of Data Reviewed Labs: ordered. Radiology: ordered.  Risk OTC drugs.   Ashlee Berger is here with chest discomfort, reflux symptoms.  Unremarkable vitals.  No fever.  Overall very well-appearing.  Atypical story for ACS.  History of high blood pressure and high cholesterol.  Heart score is 2.  She has no cardiac risk factors.  Wells criteria 0 and doubt PE.  She has some discomfort after eating tonight.  She took some Tums with improvement.  She has been taking her PPI.  Overall she is got reassuring exam.  Good pulses.  Will evaluate for ACS, pneumonia, electrolyte abnormality.  Will give GI cocktail.  Per my review interpretation labs troponin is 2.  EKG shows sinus rhythm.  No ischemic changes per my review and interpretation.  No significant anemia or electrolyte abnormality or kidney injury otherwise.  Chest x-ray shows no  evidence of pneumonia or pneumothorax.  Overall recommend that she continue her PPI take Tums.  Gave her return precautions.  Will have her follow-up with her primary care doctor.  May need further cardiac workup outpatient if symptoms become more exertional or more concerning.  Gave her return precautions which she understood.  At this time I have very low suspicion for ACS or other cardiac or pulmonary  process.  Understands return precautions and discharge.  This chart was dictated using voice recognition software.  Despite best efforts to proofread,  errors can occur which can change the documentation meaning.         Final Clinical Impression(s) / ED Diagnoses Final diagnoses:  Atypical chest pain    Rx / DC Orders ED Discharge Orders     None         Virgina Norfolk, DO 03/07/23 2250

## 2023-09-29 ENCOUNTER — Emergency Department (HOSPITAL_BASED_OUTPATIENT_CLINIC_OR_DEPARTMENT_OTHER)
Admission: EM | Admit: 2023-09-29 | Discharge: 2023-09-29 | Disposition: A | Discharge status: Home / Self Care | Payer: BC Managed Care – PPO | Attending: Emergency Medicine | Admitting: Emergency Medicine

## 2023-09-29 ENCOUNTER — Other Ambulatory Visit: Payer: Self-pay

## 2023-09-29 ENCOUNTER — Emergency Department (HOSPITAL_BASED_OUTPATIENT_CLINIC_OR_DEPARTMENT_OTHER): Payer: BC Managed Care – PPO

## 2023-09-29 ENCOUNTER — Encounter (HOSPITAL_BASED_OUTPATIENT_CLINIC_OR_DEPARTMENT_OTHER): Payer: Self-pay | Admitting: Emergency Medicine

## 2023-09-29 DIAGNOSIS — I10 Essential (primary) hypertension: Secondary | ICD-10-CM | POA: Diagnosis not present

## 2023-09-29 DIAGNOSIS — R051 Acute cough: Secondary | ICD-10-CM | POA: Insufficient documentation

## 2023-09-29 DIAGNOSIS — R079 Chest pain, unspecified: Secondary | ICD-10-CM | POA: Insufficient documentation

## 2023-09-29 DIAGNOSIS — Z79899 Other long term (current) drug therapy: Secondary | ICD-10-CM | POA: Diagnosis not present

## 2023-09-29 DIAGNOSIS — R059 Cough, unspecified: Secondary | ICD-10-CM | POA: Diagnosis present

## 2023-09-29 DIAGNOSIS — R091 Pleurisy: Secondary | ICD-10-CM

## 2023-09-29 DIAGNOSIS — R0981 Nasal congestion: Secondary | ICD-10-CM | POA: Diagnosis not present

## 2023-09-29 LAB — D-DIMER, QUANTITATIVE: D-Dimer, Quant: <0.27 ug{FEU}/mL (ref 0.00–0.50)

## 2023-09-29 LAB — CBC
HCT: 31.9 % — ABNORMAL LOW (ref 36.0–46.0)
Hemoglobin: 10.6 g/dL — ABNORMAL LOW (ref 12.0–15.0)
MCH: 30.5 pg (ref 26.0–34.0)
MCHC: 33.2 g/dL (ref 30.0–36.0)
MCV: 91.9 fL (ref 80.0–100.0)
Platelets: 386 10*3/uL (ref 150–400)
RBC: 3.47 MIL/uL — ABNORMAL LOW (ref 3.87–5.11)
RDW: 12.6 % (ref 11.5–15.5)
WBC: 5.2 10*3/uL (ref 4.0–10.5)
nRBC: 0 % (ref 0.0–0.2)

## 2023-09-29 LAB — BASIC METABOLIC PANEL WITH GFR
Anion gap: 7 (ref 5–15)
BUN: 16 mg/dL (ref 6–20)
CO2: 26 mmol/L (ref 22–32)
Calcium: 8.9 mg/dL (ref 8.9–10.3)
Chloride: 108 mmol/L (ref 98–111)
Creatinine, Ser: 0.88 mg/dL (ref 0.44–1.00)
GFR, Estimated: >60 mL/min
Glucose, Bld: 85 mg/dL (ref 70–99)
Potassium: 3.2 mmol/L — ABNORMAL LOW (ref 3.5–5.1)
Sodium: 141 mmol/L (ref 135–145)

## 2023-09-29 LAB — HCG, SERUM, QUALITATIVE: Preg, Serum: NEGATIVE

## 2023-09-29 MED ORDER — ALBUTEROL SULFATE HFA 108 (90 BASE) MCG/ACT IN AERS
2.0000 | INHALATION_SPRAY | Freq: Once | RESPIRATORY_TRACT | Status: AC
  Administered 2023-09-29: 2 via RESPIRATORY_TRACT
  Filled 2023-09-29: qty 6.7

## 2023-09-29 NOTE — ED Notes (Signed)
 Pt on monitor

## 2023-09-29 NOTE — ED Provider Notes (Signed)
[place] [facility] Provider Note   CSN: [id number] Arrival date & time: 09/29/23  0445     History  Chief Complaint  Patient presents with   Cough    Ashlee Berger is a 48 y.o. female.  The history is provided by the patient.  Patient with history of hypertension presents with continued cough. Patient reports around January 30 she started having cough and cold symptoms.  She was diagnosed with influenza at an outside urgent care with a nasal swab.  She was placed on Tamiflu and cough medications.  Most of her symptoms improved, but she reported continued dry cough.  Last night she woke up with severe attack of coughing and produced sputum with small amount of blood.  She also reports having sharp chest pain with inspiration.  No significant shortness of breath at this time. Patient is a non-smoker.  No history of CAD/CVA/VTE.  No recent travel or surgery.  She is not on any estrogen products.   No history of asthma or COPD Home Medications Prior to Admission medications   Medication Sig Start Date End Date Taking? Authorizing Provider  atorvastatin (LIPITOR) 10 MG tablet Take 10 mg by mouth at bedtime. 01/17/20   [provider]  cholecalciferol (VITAMIN D) 1000 units tablet Take 1,000 Units by mouth daily.    [provider]  ergocalciferol (VITAMIN D2) 1.25 MG (50000 UT) capsule ergocalciferol (vitamin D2) 1,250 mcg (50,000 unit) capsule    [provider]  LISINOPRIL PO Take by mouth.    [provider]  methocarbamol (ROBAXIN) 500 MG tablet Take 1 tablet (500 mg total) by mouth 2 (two) times daily. 10/06/17   Rise Mu, PA-C  omeprazole (PRILOSEC) 20 MG capsule Take 1 capsule (20 mg total) by mouth daily. 10/06/17   Rise Mu, PA-C  pantoprazole (PROTONIX) 40 MG tablet pantoprazole 40 mg tablet,delayed release 11/18/18   [provider]      Allergies    Patient has no known  allergies.    Review of Systems   Review of Systems  Constitutional:  Negative for fever.  HENT:  Negative for nosebleeds.   Respiratory:  Positive for cough.   Cardiovascular:  Positive for chest pain. Negative for leg swelling.    Physical Exam Updated Vital Signs BP (!) 121/91 (BP Location: Right Arm)   Pulse 97   Temp 98.3 F (36.8 C) (Oral)   Resp 18   Ht 1.575 m (5\' 2" )   Wt 80.7 kg   LMP 07/14/2023   SpO2 100%   BMI 32.56 kg/m  Physical Exam CONSTITUTIONAL: Well developed/well nourished, mildly anxious HEAD: Normocephalic/atraumatic EYES: EOMI/PERRL ENMT: Mucous membranes moist, no blood in oropharynx.  No stridor or drooling NECK: supple no meningeal signs CV: S1/S2 noted, tachycardic LUNGS: Lungs are clear to auscultation bilaterally, no apparent distress Chest-no tenderness chest wall ABDOMEN: soft, nontender NEURO: Pt is awake/alert/appropriate, moves all extremitiesx4.  No facial droop.   EXTREMITIES: pulses normal/equal, full ROM, no lower extremity tenderness SKIN: warm, color normal PSYCH: Anxious  ED Results / Procedures / Treatments   Labs (all labs ordered are listed, but only abnormal results are displayed) Labs Reviewed  CBC - Abnormal; Notable for the following components:      Result Value   RBC 3.47 (*)    Hemoglobin 10.6 (*)    HCT 31.9 (*)    All other components within normal limits  BASIC METABOLIC PANEL - Abnormal; Notable  for the following components:   Potassium 3.2 (*)    All other components within normal limits  D-DIMER, QUANTITATIVE  HCG, SERUM, QUALITATIVE    EKG EKG Interpretation Date/Time:  Sunday September 29 2023 05:27:28 EST Ventricular Rate:  88 PR Interval:  154 QRS Duration:  91 QT Interval:  348 QTC Calculation: 421 R Axis:   25  Text Interpretation: Sinus rhythm Abnormal R-wave progression, early transition Probable left ventricular hypertrophy No significant change since last tracing Confirmed by Zadie Rhine ([id number]) on 09/29/2023 5:31:06 AM  Radiology DG Chest Port 1 View Result Date: 09/29/2023 CLINICAL DATA:  48 year old female with history of cough. EXAM: PORTABLE CHEST 1 VIEW COMPARISON:  Chest x-ray 03/07/2023. FINDINGS: Lung volumes are normal. No consolidative airspace disease. No pleural effusions. No pneumothorax. No pulmonary nodule or mass noted. Pulmonary vasculature and the cardiomediastinal silhouette are within normal limits. IMPRESSION: No radiographic evidence of acute cardiopulmonary disease. Electronically Signed   By: Trudie Reed M.D.   On: 09/29/2023 06:01    Procedures Procedures    Medications Ordered in ED Medications  albuterol (VENTOLIN HFA) 108 (90 Base) MCG/ACT inhaler 2 puff (2 puffs Inhalation Given 09/29/23 0546)    ED Course/ Medical Decision Making/ A&P Clinical Course as of 09/29/23 0636  Sun Sep 29, 2023  6045 Patient reports recent influenza diagnosis but still having persistent cough.  Tonight she had an episode of cough with small amount hemoptysis as well as chest pain.  Overall workup was unremarkable.  Patient is afebrile, not hypoxic or tachycardic at this time, current heart rate is 87.  Patient is in no acute distress.  X-ray, labs and D-dimer were all negative.  Suspect this is a postviral cough.  Patient given albuterol for comfort. Patient safe for discharge [DW]    Clinical Course User Index [DW] Zadie Rhine, MD                                 Medical Decision Making Amount and/or Complexity of Data Reviewed Labs: ordered. Radiology: ordered.  Risk Prescription drug management.   This patient presents to the ED for concern of cough and chest pain, this involves an extensive number of treatment options, and is a complaint that carries with it a high risk of complications and morbidity.  The differential diagnosis includes but is not limited to acute coronary syndrome, aortic dissection, pulmonary embolism, pericarditis,  pneumothorax, pneumonia, myocarditis, pleurisy, esophageal rupture, postviral cough   Comorbidities that complicate the patient evaluation: Patient's presentation is complicated by their history of hypertension  Additional history obtained: Records reviewed Care Everywhere/External Records  Lab Tests: I Ordered, and personally interpreted labs.  The pertinent results include: Labs unremarkable  Imaging Studies ordered: I ordered imaging studies including X-ray chest   I independently visualized and interpreted imaging which showed no acute findings I agree with the radiologist interpretation  Cardiac Monitoring: The patient was maintained on a cardiac monitor.  I personally viewed and interpreted the cardiac monitor which showed an underlying rhythm of:  sinus rhythm  Medicines ordered and prescription drug management: I ordered medication including albuterol for cough Reevaluation of the patient after these medicines showed that the patient    stayed the same  Test Considered: Patient is low risk / negative by Wells criteria, therefore do not feel that CT chest is indicated.  Reevaluation: After the interventions noted above, I reevaluated the patient and found that  they have :improved  Complexity of problems addressed: Patient's presentation is most consistent with  acute presentation with potential threat to life or bodily function  Disposition: After consideration of the diagnostic results and the patient's response to treatment,  I feel that the patent would benefit from discharge   .           Final Clinical Impression(s) / ED Diagnoses Final diagnoses:  Acute cough  Pleurisy    Rx / DC Orders ED Discharge Orders     None         Zadie Rhine, MD 09/29/23 [phone]

## 2023-09-29 NOTE — ED Triage Notes (Addendum)
Pt reports being dx with flu on 1/31 and comes in today because she is still coughing. She reports cough has not worsened, just has not gone away. She states that this morning she woke up "coughing really bad and coughed up some mucous with blood in it." Denies fever, or other sx. Also mentions sharp pain with inhalation that she pinpoints to substernal area.

## 2024-06-25 ENCOUNTER — Emergency Department (HOSPITAL_BASED_OUTPATIENT_CLINIC_OR_DEPARTMENT_OTHER)

## 2024-06-25 ENCOUNTER — Encounter (HOSPITAL_BASED_OUTPATIENT_CLINIC_OR_DEPARTMENT_OTHER): Payer: Self-pay

## 2024-06-25 ENCOUNTER — Emergency Department (HOSPITAL_BASED_OUTPATIENT_CLINIC_OR_DEPARTMENT_OTHER)
Admission: EM | Admit: 2024-06-25 | Discharge: 2024-06-25 | Disposition: A | Discharge status: Home / Self Care | Attending: Emergency Medicine | Admitting: Emergency Medicine

## 2024-06-25 DIAGNOSIS — R519 Headache, unspecified: Secondary | ICD-10-CM | POA: Diagnosis present

## 2024-06-25 MED ORDER — PREDNISONE 20 MG PO TABS: 40.0000 mg | ORAL_TABLET | Freq: Every day | ORAL | 0 refills | Status: AC

## 2024-06-25 MED ORDER — AMOXICILLIN 500 MG PO CAPS: 1000.0000 mg | ORAL_CAPSULE | Freq: Two times a day (BID) | ORAL | 0 refills | Status: AC

## 2024-06-25 NOTE — ED Provider Notes (Signed)
 [place] [facility] Provider Note   CSN: [id number] Arrival date & time: 06/25/24  9690     Patient presents with: Headache   Ashlee Berger is a 48 y.o. female.   Presents with right frontal headache that has been present for 2 days.  She took Aleve 1 time and it did improve but came back.  Pain has been waxing and waning.  No fever.  She does report that she has some stuffiness to her nose and nasal congestion but no sore throat, cough or other cold symptoms.  Denies neck pain, stiffness.       Prior to Admission medications   Medication Sig Start Date End Date Taking? Authorizing Provider  amoxicillin (AMOXIL) 500 MG capsule Take 2 capsules (1,000 mg total) by mouth 2 (two) times daily. 06/25/24  Yes Latresha Yahr, Lonni PARAS, MD  predniSONE (DELTASONE) 20 MG tablet Take 2 tablets (40 mg total) by mouth daily with breakfast. 06/25/24  Yes Drevon Plog, Lonni PARAS, MD  atorvastatin (LIPITOR) 10 MG tablet Take 10 mg by mouth at bedtime. 01/17/20   [provider]  cholecalciferol (VITAMIN D) 1000 units tablet Take 1,000 Units by mouth daily.    [provider]  ergocalciferol (VITAMIN D2) 1.25 MG (50000 UT) capsule ergocalciferol (vitamin D2) 1,250 mcg (50,000 unit) capsule    [provider]  LISINOPRIL PO Take by mouth.    [provider]  methocarbamol  (ROBAXIN ) 500 MG tablet Take 1 tablet (500 mg total) by mouth 2 (two) times daily. 10/06/17   Annabell Vinie DASEN, PA-C  omeprazole  (PRILOSEC) 20 MG capsule Take 1 capsule (20 mg total) by mouth daily. 10/06/17   Annabell Vinie DASEN, PA-C  pantoprazole (PROTONIX) 40 MG tablet pantoprazole 40 mg tablet,delayed release 11/18/18   [provider]    Allergies: Patient has no known allergies.    Review of Systems  Updated Vital Signs BP (!) 138/99 (BP Location: Right Arm)   Pulse 82   Temp 98 F (36.7 C) (Oral)   Resp 16   Ht 5' 2 (1.575 m)   Wt 88.9  kg   SpO2 100%   BMI 35.85 kg/m   Physical Exam Vitals and nursing note reviewed.  Constitutional:      General: She is not in acute distress.    Appearance: She is well-developed.  HENT:     Head: Normocephalic and atraumatic.     Mouth/Throat:     Mouth: Mucous membranes are moist.  Eyes:     General: Vision grossly intact. Gaze aligned appropriately.     Extraocular Movements: Extraocular movements intact.     Conjunctiva/sclera: Conjunctivae normal.  Cardiovascular:     Rate and Rhythm: Normal rate and regular rhythm.     Pulses: Normal pulses.     Heart sounds: Normal heart sounds, S1 normal and S2 normal. No murmur heard.    No friction rub. No gallop.  Pulmonary:     Effort: Pulmonary effort is normal. No respiratory distress.     Breath sounds: Normal breath sounds.  Abdominal:     General: Bowel sounds are normal.     Palpations: Abdomen is soft.     Tenderness: There is no abdominal tenderness. There is no guarding or rebound.     Hernia: No hernia is present.  Musculoskeletal:        General: No swelling.     Cervical back: Full passive range of motion without pain, normal range of motion  and neck supple. No spinous process tenderness or muscular tenderness. Normal range of motion.     Right lower leg: No edema.     Left lower leg: No edema.  Skin:    General: Skin is warm and dry.     Capillary Refill: Capillary refill takes less than 2 seconds.     Findings: No ecchymosis, erythema, rash or wound.  Neurological:     General: No focal deficit present.     Mental Status: She is alert and oriented to person, place, and time.     GCS: GCS eye subscore is 4. GCS verbal subscore is 5. GCS motor subscore is 6.     Cranial Nerves: Cranial nerves 2-12 are intact.     Sensory: Sensation is intact.     Motor: Motor function is intact.     Coordination: Coordination is intact.  Psychiatric:        Attention and Perception: Attention normal.        Mood and Affect:  Mood normal.        Speech: Speech normal.        Behavior: Behavior normal.     (all labs ordered are listed, but only abnormal results are displayed) Labs Reviewed - No data to display  EKG: None  Radiology: CT HEAD WO CONTRAST ( ) Result Date: 06/25/2024 EXAM: CT HEAD WITHOUT CONTRAST 06/25/2024 04:42:46 AM TECHNIQUE: CT of the head was performed without the administration of intravenous contrast. Automated exposure control, iterative reconstruction, and/or weight based adjustment of the mA/kV was utilized to reduce the radiation dose to as low as reasonably achievable. COMPARISON: None available. CLINICAL HISTORY: Headache, increasing frequency or severity FINDINGS: BRAIN AND VENTRICLES: No acute hemorrhage. No evidence of acute infarct. No hydrocephalus. No extra-axial collection. No mass effect or midline shift. ORBITS: No acute abnormality. SINUSES: Polypoid mucosal thickening in maxillary sinuses. SOFT TISSUES AND SKULL: No acute soft tissue abnormality. No skull fracture. IMPRESSION: 1. No acute intracranial abnormality. 2. Polypoid mucosal thickening in maxillary sinuses. Electronically signed by: Evalene Coho MD 06/25/2024 04:48 AM EST RP Workstation: [id number]     Procedures   Medications Ordered in the ED - No data to display                                  Medical Decision Making Amount and/or Complexity of Data Reviewed Radiology: ordered and independent interpretation performed. Decision-making details documented in ED Course.   Differential Diagnosis considered includes, but not limited to: Hypertensive emergencies, Idiopathic intracranial hypertension, Space occupying lesions (tumors, abscesses, cysts), Acute hydrocephalus, Dural sinus thrombosis, Intracranial hemorrhage, Cerebrovascular accident or stroke, Meningitis and encephalitis, Migraine HA, Tension HA.  Presents to the emergency for evaluation of headache for 2 days.  Patient has a normal neurologic  exam.  Headache has been waxing and waning, no red flags.  She does have nasal congestion.  CT scan shows normal brain but does show evidence of sinus inflammation.  Will treat for sinusitis.     Final diagnoses:  Sinus headache    ED Discharge Orders          Ordered    amoxicillin (AMOXIL) 500 MG capsule  2 times daily        06/25/24 0451    predniSONE (DELTASONE) 20 MG tablet  Daily with breakfast        06/25/24 0451  Haze Lonni PARAS, MD 06/25/24 [id number]

## 2024-06-25 NOTE — ED Triage Notes (Signed)
 Headache on/off x 2 days No other symptoms
